# Patient Record
Sex: Female | Born: 1965 | Race: White | Hispanic: No | Marital: Married | Smoking: Former smoker
Health system: Southern US, Community
[De-identification: ages and names within clinical notes are randomized; demographics above are authoritative.]

## PROBLEM LIST (undated history)

## (undated) DIAGNOSIS — Z7189 Other specified counseling: Secondary | ICD-10-CM

## (undated) DIAGNOSIS — M1990 Unspecified osteoarthritis, unspecified site: Secondary | ICD-10-CM

## (undated) DIAGNOSIS — I10 Essential (primary) hypertension: Secondary | ICD-10-CM

## (undated) DIAGNOSIS — T7840XA Allergy, unspecified, initial encounter: Secondary | ICD-10-CM

## (undated) DIAGNOSIS — E785 Hyperlipidemia, unspecified: Secondary | ICD-10-CM

## (undated) DIAGNOSIS — J4520 Mild intermittent asthma, uncomplicated: Secondary | ICD-10-CM

## (undated) DIAGNOSIS — E119 Type 2 diabetes mellitus without complications: Secondary | ICD-10-CM

## (undated) DIAGNOSIS — R600 Localized edema: Secondary | ICD-10-CM

## (undated) HISTORY — DX: Hyperlipidemia, unspecified: E78.5

## (undated) HISTORY — DX: Type 2 diabetes mellitus without complications: E11.9

## (undated) HISTORY — DX: Localized edema: R60.0

## (undated) HISTORY — DX: Allergy, unspecified, initial encounter: T78.40XA

## (undated) HISTORY — DX: Mild intermittent asthma, uncomplicated: J45.20

## (undated) HISTORY — DX: Essential (primary) hypertension: I10

## (undated) HISTORY — DX: Other specified counseling: Z71.89

## (undated) HISTORY — DX: Unspecified osteoarthritis, unspecified site: M19.90

## (undated) HISTORY — PX: ADENOIDECTOMY: SUR15

---

## 2010-11-16 ENCOUNTER — Encounter: Payer: Self-pay | Admitting: Internal Medicine

## 2010-11-16 ENCOUNTER — Ambulatory Visit (INDEPENDENT_AMBULATORY_CARE_PROVIDER_SITE_OTHER): Payer: Managed Care, Other (non HMO) | Admitting: Internal Medicine

## 2010-11-16 VITALS — BP 120/92 | HR 98 | Ht 65.0 in | Wt 278.0 lb

## 2010-11-16 DIAGNOSIS — Z0000 Encounter for general adult medical examination without abnormal findings: Secondary | ICD-10-CM

## 2010-11-16 DIAGNOSIS — J45909 Unspecified asthma, uncomplicated: Secondary | ICD-10-CM

## 2010-11-16 DIAGNOSIS — E669 Obesity, unspecified: Secondary | ICD-10-CM | POA: Insufficient documentation

## 2010-11-16 DIAGNOSIS — I10 Essential (primary) hypertension: Secondary | ICD-10-CM

## 2010-11-16 DIAGNOSIS — J4520 Mild intermittent asthma, uncomplicated: Secondary | ICD-10-CM | POA: Insufficient documentation

## 2010-11-16 DIAGNOSIS — Z23 Encounter for immunization: Secondary | ICD-10-CM

## 2010-11-16 HISTORY — DX: Mild intermittent asthma, uncomplicated: J45.20

## 2010-11-16 MED ORDER — ALBUTEROL 90 MCG/ACT IN AERS: 2.0000 | INHALATION_SPRAY | Freq: Four times a day (QID) | RESPIRATORY_TRACT | Status: DC | PRN

## 2010-11-16 MED ORDER — LISINOPRIL 10 MG PO TABS: 10.0000 mg | ORAL_TABLET | Freq: Every day | ORAL | Status: DC

## 2010-11-16 NOTE — Assessment & Plan Note (Signed)
Stable and asx. RF albuterol mdi prn

## 2010-11-16 NOTE — Assessment & Plan Note (Signed)
Discussed dietary modification and regular aerobic exercise. Target wt loss 15lbs at 2 month follow

## 2010-11-16 NOTE — Progress Notes (Signed)
  Subjective:    Patient ID: Phyllis Martinez, female    DOB: Jun 22, 1965, 45 y.o.   MRN: [id number]  HPI  Pt presents to clinic to establish care and for evaluation of HTN and asthma. H/o HTN with minimal elevation today but typically normotensive control. Tolerates ace inhibitor without cough or angioedema. H/o asthma though has been well controlled without recent flare. Denies dyspnea or wheezing. Uses albuterol mdi prn but notes improved control since moving from [place] to [place]. No associated GERD sx's. Reports nl labs 2/12. Tetanus booster last obtained 1990. Last pap/cpe 10/11 and nl. Has never had mammogram and currently defers. No other complaints.   Reviewed pmh, psh, medications, allergies, soc hx and fam hx.    Review of Systems  Constitutional: Positive for unexpected weight change. Negative for fever, chills and fatigue.  HENT: Negative for hearing loss, congestion and facial swelling.   Respiratory: Negative for cough and shortness of breath.   Cardiovascular: Negative for chest pain and palpitations.  Gastrointestinal: Negative for abdominal pain and blood in stool.  All other systems reviewed and are negative.       Objective:   Physical Exam  Nursing note and vitals reviewed. Constitutional: She appears well-developed and well-nourished. No distress.  HENT:  Head: Normocephalic and atraumatic.  Right Ear: External ear normal.  Left Ear: External ear normal.  Nose: Nose normal.  Mouth/Throat: Oropharynx is clear and moist. No oropharyngeal exudate.  Eyes: Conjunctivae are normal. Right eye exhibits no discharge. Left eye exhibits no discharge. No scleral icterus.  Neck: Neck supple. No JVD present.  Cardiovascular: Normal rate, regular rhythm and normal heart sounds.  Exam reveals no gallop and no friction rub.   No murmur heard. Pulmonary/Chest: Effort normal and breath sounds normal. No respiratory distress. She has no wheezes. She has no rales.  Lymphadenopathy:    She has  no cervical adenopathy.  Neurological: She is alert.  Skin: Skin is warm and dry. No rash noted. She is not diaphoretic.       Left forearm nevi without irregularity or irritation.  Psychiatric: She has a normal mood and affect.          Assessment & Plan:

## 2010-11-16 NOTE — Assessment & Plan Note (Signed)
Mildly suboptimal control but appears isolated. Continue current regimen and monitor bp as outpt.

## 2011-02-14 ENCOUNTER — Ambulatory Visit: Payer: Managed Care, Other (non HMO) | Admitting: Internal Medicine

## 2011-05-13 ENCOUNTER — Encounter: Payer: Self-pay | Admitting: Internal Medicine

## 2011-05-13 ENCOUNTER — Ambulatory Visit (INDEPENDENT_AMBULATORY_CARE_PROVIDER_SITE_OTHER): Payer: 59 | Admitting: Internal Medicine

## 2011-05-13 VITALS — BP 134/90 | HR 76 | Temp 98.5°F | Resp 18 | Ht 64.0 in | Wt 276.0 lb

## 2011-05-13 DIAGNOSIS — Z0000 Encounter for general adult medical examination without abnormal findings: Secondary | ICD-10-CM

## 2011-05-13 LAB — CBC
HCT: 41 % (ref 36.0–46.0)
Hemoglobin: 13.6 g/dL (ref 12.0–15.0)
RBC: 4.6 MIL/uL (ref 3.87–5.11)
WBC: 8.7 10*3/uL (ref 4.0–10.5)

## 2011-05-13 LAB — BASIC METABOLIC PANEL
BUN: 11 mg/dL (ref 6–23)
CO2: 26 mEq/L (ref 19–32)
Chloride: 104 mEq/L (ref 96–112)
Glucose, Bld: 90 mg/dL (ref 70–99)
Potassium: 4.2 mEq/L (ref 3.5–5.3)

## 2011-05-13 LAB — HEPATIC FUNCTION PANEL
Bilirubin, Direct: 0.1 mg/dL (ref 0.0–0.3)
Indirect Bilirubin: 0.3 mg/dL (ref 0.0–0.9)
Total Protein: 7 g/dL (ref 6.0–8.3)

## 2011-05-13 LAB — LIPID PANEL
LDL Cholesterol: 140 mg/dL — ABNORMAL HIGH (ref 0–99)
VLDL: 44 mg/dL — ABNORMAL HIGH (ref 0–40)

## 2011-05-13 LAB — TSH: TSH: 1.815 u[IU]/mL (ref 0.350–4.500)

## 2011-05-13 MED ORDER — FLUTICASONE PROPIONATE HFA 110 MCG/ACT IN AERO: 2.0000 | INHALATION_SPRAY | Freq: Two times a day (BID) | RESPIRATORY_TRACT | Status: DC

## 2011-05-13 NOTE — Progress Notes (Signed)
  Subjective:    Patient ID: Phyllis Martinez, female    DOB: 1966-04-11, 45 y.o.   MRN: [id number]  HPI Pt presents to clinic for annual exam. Notes asthma control has worsened due to tobacco smoke exposure at work. Using albuterol mdi on average twice a day. No current wheezing or dyspnea. Recalls possible side effects from advair but is unsure. DBP minimally elevated but recent stress in life. Compliant with medication without adverse effect. Pap utd 2/12. Has not undergone mammogram and not currently interested. No other complaints.  Past Medical History  Diagnosis Date  . Arthritis     RA  . Asthma   . Allergy   . Hypertension    Past Surgical History  Procedure Date  . Cesarean section   . Adenoidectomy     reports that she has quit smoking. She has never used smokeless tobacco. She reports that she drinks alcohol. Her drug history not on file. family history includes Arthritis in her father and maternal grandmother; Cancer in her father; Heart disease in her daughters; and Hyperlipidemia in her mother. No Known Allergies   Review of Systems see hpi     Objective:   Physical Exam  Nursing note and vitals reviewed. Constitutional: She appears well-developed and well-nourished. No distress.  HENT:  Head: Normocephalic and atraumatic.  Right Ear: Tympanic membrane, external ear and ear canal normal.  Left Ear: Tympanic membrane, external ear and ear canal normal.  Nose: Nose normal.  Mouth/Throat: Oropharynx is clear and moist. No oropharyngeal exudate.  Eyes: Conjunctivae and EOM are normal. Pupils are equal, round, and reactive to light. Right eye exhibits no discharge. Left eye exhibits no discharge. No scleral icterus.  Neck: Neck supple. No thyromegaly present.  Cardiovascular: Normal rate, regular rhythm, normal heart sounds and intact distal pulses.  Exam reveals no gallop and no friction rub.   No murmur heard. Pulmonary/Chest: Effort normal and breath sounds normal. No  respiratory distress. She has no wheezes. She has no rales.  Abdominal: Soft. Normal appearance and bowel sounds are normal. She exhibits no distension. There is no hepatosplenomegaly. There is no tenderness. There is no rebound and no guarding.  Lymphadenopathy:    She has no cervical adenopathy.  Neurological: She is alert.  Skin: Skin is warm and dry. She is not diaphoretic.  Psychiatric: She has a normal mood and affect.          Assessment & Plan:

## 2011-05-13 NOTE — Assessment & Plan Note (Signed)
Nl exam. Pap utd. Declines mammogram currently. Administer influenza vaccine. Obtain cpe screening labs. Add flovent daily to asthma regimen (rinse mouth after using). Monitor bp as outpt and if remains elevated notify clinic for dose adjustment.

## 2011-05-14 LAB — URINALYSIS, ROUTINE W REFLEX MICROSCOPIC
Bilirubin Urine: NEGATIVE
Nitrite: NEGATIVE
Specific Gravity, Urine: 1.021 (ref 1.005–1.030)
Urobilinogen, UA: 0.2 mg/dL (ref 0.0–1.0)

## 2011-05-18 ENCOUNTER — Other Ambulatory Visit: Payer: Self-pay | Admitting: Internal Medicine

## 2011-05-18 DIAGNOSIS — E785 Hyperlipidemia, unspecified: Secondary | ICD-10-CM

## 2011-05-21 ENCOUNTER — Encounter: Payer: Self-pay | Admitting: Internal Medicine

## 2011-10-14 ENCOUNTER — Ambulatory Visit: Payer: 59 | Admitting: Internal Medicine

## 2011-11-28 ENCOUNTER — Other Ambulatory Visit: Payer: Self-pay | Admitting: Internal Medicine

## 2011-11-28 NOTE — Telephone Encounter (Signed)
Rx refill sent to pharmacy. 

## 2011-12-01 ENCOUNTER — Other Ambulatory Visit: Payer: Self-pay | Admitting: Internal Medicine

## 2011-12-02 NOTE — Telephone Encounter (Signed)
Rx refill sent to pharmacy. 

## 2012-01-20 ENCOUNTER — Telehealth: Payer: Self-pay | Admitting: Internal Medicine

## 2012-01-20 MED ORDER — LISINOPRIL 10 MG PO TABS: ORAL_TABLET | ORAL | Status: DC

## 2012-01-20 NOTE — Telephone Encounter (Signed)
Refill- lisinopril 10mg  tablets. Take one tablet by mouth every day. Qty 30 last fill 7.20.13

## 2012-01-20 NOTE — Telephone Encounter (Signed)
Rx 30-day supply given: Patient Overdue for OV/SLS

## 2012-02-10 ENCOUNTER — Ambulatory Visit (INDEPENDENT_AMBULATORY_CARE_PROVIDER_SITE_OTHER): Payer: Managed Care, Other (non HMO) | Admitting: Internal Medicine

## 2012-02-10 ENCOUNTER — Encounter: Payer: Self-pay | Admitting: Internal Medicine

## 2012-02-10 VITALS — BP 124/82 | HR 68 | Temp 98.1°F | Resp 16 | Ht 64.5 in | Wt 300.5 lb

## 2012-02-10 DIAGNOSIS — E041 Nontoxic single thyroid nodule: Secondary | ICD-10-CM

## 2012-02-10 DIAGNOSIS — Z79899 Other long term (current) drug therapy: Secondary | ICD-10-CM

## 2012-02-10 DIAGNOSIS — I10 Essential (primary) hypertension: Secondary | ICD-10-CM

## 2012-02-10 DIAGNOSIS — E785 Hyperlipidemia, unspecified: Secondary | ICD-10-CM

## 2012-02-11 LAB — BASIC METABOLIC PANEL
Calcium: 9.5 mg/dL (ref 8.4–10.5)
Sodium: 138 mEq/L (ref 135–145)

## 2012-02-11 LAB — LIPID PANEL
Cholesterol: 250 mg/dL — ABNORMAL HIGH (ref 0–200)
HDL: 40 mg/dL (ref >39)
Total CHOL/HDL Ratio: 6.3 Ratio
Triglycerides: 248 mg/dL — ABNORMAL HIGH (ref <150)

## 2012-02-11 LAB — TSH: TSH: 2.469 u[IU]/mL (ref 0.350–4.500)

## 2012-02-12 NOTE — Progress Notes (Signed)
  Subjective:    Patient ID: Phyllis Martinez, female    DOB: [birth year], 46 y.o.   MRN: [id number]  HPI Pt presents to clinic for followup of multiple medical problems. BP reviewed normotensive. No recent asthma flares or dyspnea. No cough or lip swelling. Defers mammogram.  Past Medical History  Diagnosis Date  . Arthritis     RA  . Asthma   . Allergy   . Hypertension    Past Surgical History  Procedure Date  . Cesarean section   . Adenoidectomy     reports that she has quit smoking. She has never used smokeless tobacco. She reports that she drinks alcohol. Her drug history not on file. family history includes Arthritis in her father and maternal grandmother; Cancer in her father; Heart disease in her daughters; and Hyperlipidemia in her mother. No Known Allergies    Review of Systems see hpi     Objective:   Physical Exam  Physical Exam  Nursing note and vitals reviewed. Constitutional: Appears well-developed and well-nourished. No distress.  HENT:  Head: Normocephalic and atraumatic.  Right Ear: External ear normal.  Left Ear: External ear normal.  Eyes: Conjunctivae are normal. No scleral icterus.  Neck: Neck supple. Carotid bruit is not present. possible small left thyroid nodule. NT Cardiovascular: Normal rate, regular rhythm and normal heart sounds.  Exam reveals no gallop and no friction rub.   No murmur heard. Pulmonary/Chest: Effort normal and breath sounds normal. No respiratory distress. He has no wheezes. no rales.  Lymphadenopathy:    He has no cervical adenopathy.  Neurological:Alert.  Skin: Skin is warm and dry. Not diaphoretic.  Psychiatric: Has a normal mood and affect.        Assessment & Plan:

## 2012-02-12 NOTE — Assessment & Plan Note (Signed)
Normotensive and stable. Continue current regimen. Monitor bp as outpt and followup in clinic as scheduled. Obtain chem7 and lipid. 

## 2012-02-12 NOTE — Assessment & Plan Note (Signed)
Schedule thyroid US. Obtain tsh

## 2012-02-13 ENCOUNTER — Ambulatory Visit (HOSPITAL_BASED_OUTPATIENT_CLINIC_OR_DEPARTMENT_OTHER)
Admission: RE | Admit: 2012-02-13 | Discharge: 2012-02-13 | Disposition: A | Discharge status: Home / Self Care | Payer: Managed Care, Other (non HMO) | Source: Ambulatory Visit | Attending: Internal Medicine | Admitting: Internal Medicine

## 2012-02-13 DIAGNOSIS — E041 Nontoxic single thyroid nodule: Secondary | ICD-10-CM

## 2012-02-23 ENCOUNTER — Telehealth: Payer: Self-pay | Admitting: *Deleted

## 2012-02-23 DIAGNOSIS — E042 Nontoxic multinodular goiter: Secondary | ICD-10-CM

## 2012-02-23 DIAGNOSIS — E785 Hyperlipidemia, unspecified: Secondary | ICD-10-CM

## 2012-02-23 NOTE — Telephone Encounter (Signed)
LMOM with contact name & number for return call RE: results & further instructions and recommendations from MD/SLS New Rx pending verification of pharmacy & future lab orders placed in EMR/SLS

## 2012-02-23 NOTE — Telephone Encounter (Signed)
Message copied by Regis Bill on Thu Feb 23, 2012  4:10 PM ------      Message from: Edwyna Perfect      Created: Sun Feb 19, 2012 10:21 PM       1) thyroid US does show nodules on both sides. Are big enough they need to be evaluated.  Recommend endocrine consult dx-thyroid nodules      2) chol worsening. Recommend low dose medication. lipitor 20mg  po qd and recheck lipid/lft 272.4 in 1 month fasting.

## 2012-02-24 ENCOUNTER — Telehealth: Payer: Self-pay | Admitting: Internal Medicine

## 2012-02-24 MED ORDER — LISINOPRIL 10 MG PO TABS: ORAL_TABLET | ORAL | Status: DC

## 2012-02-24 NOTE — Telephone Encounter (Signed)
Refill- lisinopril 10mg  tab. Take one tablet by mouth every day. Qty 30 last fill 8.16.13

## 2012-02-24 NOTE — Telephone Encounter (Signed)
Rx done/SLS 

## 2012-02-28 ENCOUNTER — Telehealth: Payer: Self-pay | Admitting: Internal Medicine

## 2012-02-28 NOTE — Telephone Encounter (Signed)
Patient is requesting last labs and sonogram results

## 2012-02-28 NOTE — Telephone Encounter (Signed)
See 9/19 phone note 

## 2012-02-29 ENCOUNTER — Other Ambulatory Visit: Payer: Self-pay | Admitting: Internal Medicine

## 2012-02-29 DIAGNOSIS — E042 Nontoxic multinodular goiter: Secondary | ICD-10-CM

## 2012-02-29 NOTE — Telephone Encounter (Signed)
Spoke with patient; Ok for Endo referral [Done in EMR], she does not want to start a new medication at this time; she would like to see endocrinology prior as she feels elevated labs could be part of thyroid issue. Pt will come in for follow-up labs/SLS

## 2012-02-29 NOTE — Telephone Encounter (Signed)
Referral order already placed 9/25

## 2012-02-29 NOTE — Telephone Encounter (Signed)
FYI: Spoke with patient; Ok for Endo referral [Done in EMR], she does not want to start a new medication at this time; she would like to see endocrinology prior as she feels elevated labs could be part of thyroid issue. Pt will come in for follow-up labs/SLS

## 2012-03-05 ENCOUNTER — Ambulatory Visit (INDEPENDENT_AMBULATORY_CARE_PROVIDER_SITE_OTHER): Payer: Managed Care, Other (non HMO) | Admitting: Endocrinology

## 2012-03-05 ENCOUNTER — Other Ambulatory Visit: Payer: Self-pay | Admitting: Endocrinology

## 2012-03-05 ENCOUNTER — Encounter: Payer: Self-pay | Admitting: Endocrinology

## 2012-03-05 VITALS — BP 124/84 | HR 73 | Temp 97.6°F | Resp 15 | Ht 64.75 in | Wt 303.0 lb

## 2012-03-05 DIAGNOSIS — E042 Nontoxic multinodular goiter: Secondary | ICD-10-CM | POA: Insufficient documentation

## 2012-03-05 NOTE — Progress Notes (Signed)
Subjective:    Patient ID: Phyllis Martinez, female    DOB: [birth year], 46 y.o.   MRN: [id number]  HPI Pt was incidentally noted at a routine ov, a few weeks ago, to have right thyroid nodule.  Pt says she was unaware of this, and has never had a thyroid problem before.  She does not notice any swelling at the anterior neck, or assoc pain.   Past Medical History  Diagnosis Date  . Arthritis     RA  . Asthma   . Allergy   . Hypertension     Past Surgical History  Procedure Date  . Cesarean section   . Adenoidectomy     History   Social History  . Marital Status: Married    Spouse Name: N/A    Number of Children: N/A  . Years of Education: N/A   Occupational History  . Not on file.   Social History Main Topics  . Smoking status: Former Games developer  . Smokeless tobacco: Never Used  . Alcohol Use: Yes  . Drug Use: Not on file  . Sexually Active: Not on file   Other Topics Concern  . Not on file   Social History Narrative  . No narrative on file    Current Outpatient Prescriptions on File Prior to Visit  Medication Sig Dispense Refill  . albuterol (VENTOLIN HFA) 108 (90 BASE) MCG/ACT inhaler Inhale 2 puffs into the lungs every 6 (six) hours as needed for wheezing. Office visit is required for refills  18 g  0  . fluticasone (FLOVENT HFA) 110 MCG/ACT inhaler Inhale 2 puffs into the lungs 2 (two) times daily.  1 Inhaler  6  . lisinopril (PRINIVIL,ZESTRIL) 10 MG tablet TAKE 1 TABLET BY MOUTH EVERY DAY  30 tablet  5  . DISCONTD: albuterol (PROVENTIL,VENTOLIN) 90 MCG/ACT inhaler Inhale 2 puffs into the lungs every 6 (six) hours as needed.  17 g  11    No Known Allergies  Family History  Problem Relation Age of Onset  . Hyperlipidemia Mother   . Arthritis Father     RA  . Cancer Father     prostate  . Heart disease Daughter   . Arthritis Maternal Grandmother   . Heart disease Daughter   mother has hypothyroidism  BP 124/84  Pulse 73  Temp 97.6 F (36.4 C) (Oral)   Resp 15  Ht 5' 4.75" (1.645 m)  Wt 303 lb (137.44 kg)  BMI 50.81 kg/m2  SpO2 96%  LMP 01/11/2012  Review of Systems denies headache, hoarseness, double vision, palpitations, sob, diarrhea, polyuria, myalgias, excessive diaphoresis, numbness, tremor, anxiety, hypoglycemia, and rhinorrhea.  She has difficulty losing weight, and easy bruising.    Objective:   Physical Exam VS: see vs page GEN: no distress HEAD: head: no deformity eyes: no periorbital swelling, no proptosis external nose and ears are normal mouth: no lesion seen NECK: 2 cm right thyroid nodule.   CHEST WALL: no deformity LUNGS:  Clear to auscultation CV: reg rate and rhythm, no murmur. ABD: abdomen is soft, nontender.  no hepatosplenomegaly.  not distended.  no hernia. MUSCULOSKELETAL: muscle bulk and strength are grossly normal.  no obvious joint swelling.  gait is normal and steady EXTEMITIES: no deformity.  no ulcer on the feet.  feet are of normal color and temp.  no edema PULSES: dorsalis pedis intact bilat.  no carotid bruit NEURO:  cn 2-12 grossly intact.   readily moves all 4's.  sensation is intact to  touch on the feet SKIN:  Normal texture and temperature.  No rash or suspicious lesion is visible.   NODES:  None palpable at the neck.   PSYCH: alert, oriented x3.  Does not appear anxious nor depressed.  Lab Results  Component Value Date   TSH 2.469 02/10/2012   (i reviewed [place] images with patient)    Assessment & Plan:  Multinodular goiter, which is usually hereditary Obesity, not thyroid-related Asthma. Goiter is not large enough to exac sob.

## 2012-03-05 NOTE — Patient Instructions (Addendum)
Let's check a biopsy of the thyroid, guided by ultrasound.  you will receive a phone call, about a day and time for an appointment. Please return in 1 year. your "lumpy thyroid" will eventually become either overactive or underactive.  this is usually a slow process, happening over the span of many years.

## 2012-03-07 ENCOUNTER — Ambulatory Visit
Admission: RE | Admit: 2012-03-07 | Discharge: 2012-03-07 | Disposition: A | Discharge status: Home / Self Care | Payer: Managed Care, Other (non HMO) | Source: Ambulatory Visit | Attending: Endocrinology | Admitting: Endocrinology

## 2012-03-07 ENCOUNTER — Other Ambulatory Visit (HOSPITAL_COMMUNITY)
Admission: RE | Admit: 2012-03-07 | Discharge: 2012-03-07 | Disposition: A | Discharge status: Home / Self Care | Payer: Managed Care, Other (non HMO) | Source: Ambulatory Visit | Attending: Interventional Radiology | Admitting: Interventional Radiology

## 2012-03-07 DIAGNOSIS — E042 Nontoxic multinodular goiter: Secondary | ICD-10-CM

## 2012-03-07 DIAGNOSIS — E049 Nontoxic goiter, unspecified: Secondary | ICD-10-CM | POA: Insufficient documentation

## 2012-03-20 ENCOUNTER — Other Ambulatory Visit: Payer: Self-pay | Admitting: Internal Medicine

## 2012-03-21 NOTE — Telephone Encounter (Signed)
Done/SLS 

## 2012-06-19 ENCOUNTER — Other Ambulatory Visit: Payer: Self-pay | Admitting: Internal Medicine

## 2012-06-19 ENCOUNTER — Telehealth: Payer: Self-pay | Admitting: Internal Medicine

## 2012-06-19 DIAGNOSIS — Z124 Encounter for screening for malignant neoplasm of cervix: Secondary | ICD-10-CM

## 2012-06-19 NOTE — Telephone Encounter (Signed)
Patient would like a referral to a gynacologist

## 2012-06-19 NOTE — Telephone Encounter (Signed)
Spoke with patient & she is not having any medical issues; would just like "referral to GYN" for annual/PAP exam/SLS

## 2012-06-19 NOTE — Telephone Encounter (Signed)
Referral order placed.

## 2012-07-21 ENCOUNTER — Other Ambulatory Visit: Payer: Self-pay

## 2012-07-24 ENCOUNTER — Other Ambulatory Visit: Payer: Self-pay | Admitting: Obstetrics and Gynecology

## 2012-07-24 DIAGNOSIS — R928 Other abnormal and inconclusive findings on diagnostic imaging of breast: Secondary | ICD-10-CM

## 2012-08-01 ENCOUNTER — Ambulatory Visit
Admission: RE | Admit: 2012-08-01 | Discharge: 2012-08-01 | Disposition: A | Discharge status: Home / Self Care | Payer: Managed Care, Other (non HMO) | Source: Ambulatory Visit | Attending: Obstetrics and Gynecology | Admitting: Obstetrics and Gynecology

## 2012-08-09 ENCOUNTER — Ambulatory Visit: Payer: Managed Care, Other (non HMO) | Admitting: Family Medicine

## 2012-08-09 ENCOUNTER — Other Ambulatory Visit: Payer: Self-pay | Admitting: Obstetrics and Gynecology

## 2012-09-05 ENCOUNTER — Telehealth: Payer: Self-pay | Admitting: Internal Medicine

## 2012-09-05 MED ORDER — LISINOPRIL 10 MG PO TABS: ORAL_TABLET | ORAL | Status: DC

## 2012-09-05 NOTE — Telephone Encounter (Signed)
Refill- lisinopril 10mg  tablets. Take one tablet by mouth every day. Qty 30 last fill 3.6.14

## 2012-10-17 ENCOUNTER — Telehealth: Payer: Self-pay | Admitting: Internal Medicine

## 2012-10-17 MED ORDER — ALBUTEROL SULFATE HFA 108 (90 BASE) MCG/ACT IN AERS: 2.0000 | INHALATION_SPRAY | Freq: Four times a day (QID) | RESPIRATORY_TRACT | Status: DC | PRN

## 2012-10-17 NOTE — Telephone Encounter (Signed)
Refill ventolin

## 2012-10-17 NOTE — Telephone Encounter (Signed)
Left a message for pt to return my call. 1 inhaler sent to pharmacy. Pt needs appt for addt refills. Dr Rodena Medin asked pt to return in March

## 2012-11-20 ENCOUNTER — Other Ambulatory Visit: Payer: Self-pay | Admitting: Obstetrics and Gynecology

## 2013-03-15 ENCOUNTER — Other Ambulatory Visit: Payer: Self-pay | Admitting: Family Medicine

## 2013-03-20 ENCOUNTER — Telehealth: Payer: Self-pay | Admitting: Internal Medicine

## 2013-03-20 DIAGNOSIS — E785 Hyperlipidemia, unspecified: Secondary | ICD-10-CM

## 2013-03-20 DIAGNOSIS — I10 Essential (primary) hypertension: Secondary | ICD-10-CM

## 2013-03-20 NOTE — Telephone Encounter (Signed)
Patient has cpe scheduled for 05/21/13 and will be going to [facility] lab prior.

## 2013-03-21 NOTE — Telephone Encounter (Signed)
Please advise which labs and diagnosis to be done?

## 2013-03-21 NOTE — Telephone Encounter (Signed)
Check lipid, renal, cbc, tsh, hepatic for hyperlipidemia and HTN

## 2013-03-25 NOTE — Telephone Encounter (Signed)
Lab order placed. I don't believe I put the expected date. Beginning of December

## 2013-04-03 ENCOUNTER — Other Ambulatory Visit: Payer: Self-pay | Admitting: Obstetrics and Gynecology

## 2013-04-11 ENCOUNTER — Other Ambulatory Visit: Payer: Self-pay

## 2013-04-16 ENCOUNTER — Ambulatory Visit (INDEPENDENT_AMBULATORY_CARE_PROVIDER_SITE_OTHER): Payer: Managed Care, Other (non HMO) | Admitting: Family Medicine

## 2013-04-16 ENCOUNTER — Telehealth: Payer: Self-pay | Admitting: Family Medicine

## 2013-04-16 ENCOUNTER — Encounter: Payer: Self-pay | Admitting: Family Medicine

## 2013-04-16 VITALS — BP 181/104 | HR 74 | Temp 98.1°F | Resp 16 | Ht 64.0 in | Wt 306.2 lb

## 2013-04-16 DIAGNOSIS — Z0000 Encounter for general adult medical examination without abnormal findings: Secondary | ICD-10-CM

## 2013-04-16 DIAGNOSIS — E785 Hyperlipidemia, unspecified: Secondary | ICD-10-CM

## 2013-04-16 DIAGNOSIS — I10 Essential (primary) hypertension: Secondary | ICD-10-CM

## 2013-04-16 DIAGNOSIS — J45909 Unspecified asthma, uncomplicated: Secondary | ICD-10-CM

## 2013-04-16 DIAGNOSIS — E669 Obesity, unspecified: Secondary | ICD-10-CM

## 2013-04-16 MED ORDER — ALBUTEROL SULFATE HFA 108 (90 BASE) MCG/ACT IN AERS: 2.0000 | INHALATION_SPRAY | Freq: Four times a day (QID) | RESPIRATORY_TRACT | Status: DC | PRN

## 2013-04-16 MED ORDER — FLUTICASONE PROPIONATE HFA 110 MCG/ACT IN AERO: 2.0000 | INHALATION_SPRAY | Freq: Two times a day (BID) | RESPIRATORY_TRACT | Status: DC | PRN

## 2013-04-16 MED ORDER — METOPROLOL SUCCINATE ER 25 MG PO TB24: 25.0000 mg | ORAL_TABLET | Freq: Every day | ORAL | Status: DC

## 2013-04-16 NOTE — Telephone Encounter (Signed)
Labs prior to annual visit lipid, renal, liver, tsh, cbc   Patient has cpe on 05/21/13 and will be going to [facility] lab

## 2013-04-16 NOTE — Patient Instructions (Signed)
DASH Diet  The DASH diet stands for "Dietary Approaches to Stop Hypertension." It is a healthy eating plan that has been shown to reduce high blood pressure (hypertension) in as little as 14 days, while also possibly providing other significant health benefits. These other health benefits include reducing the risk of breast cancer after menopause and reducing the risk of type 2 diabetes, heart disease, colon cancer, and stroke. Health benefits also include weight loss and slowing kidney failure in patients with chronic kidney disease.   DIET GUIDELINES  · Limit salt (sodium). Your diet should contain less than 1500 mg of sodium daily.  · Limit refined or processed carbohydrates. Your diet should include mostly whole grains. Desserts and added sugars should be used sparingly.  · Include small amounts of heart-healthy fats. These types of fats include nuts, oils, and tub margarine. Limit saturated and trans fats. These fats have been shown to be harmful in the body.  CHOOSING FOODS   The following food groups are based on a 2000 calorie diet. See your Registered Dietitian for individual calorie needs.  Grains and Grain Products (6 to 8 servings daily)  · Eat More Often: Whole-wheat bread, brown rice, whole-grain or wheat pasta, quinoa, popcorn without added fat or salt (air popped).  · Eat Less Often: White bread, white pasta, white rice, cornbread.  Vegetables (4 to 5 servings daily)  · Eat More Often: Fresh, frozen, and canned vegetables. Vegetables may be raw, steamed, roasted, or grilled with a minimal amount of fat.  · Eat Less Often/Avoid: Creamed or fried vegetables. Vegetables in a cheese sauce.  Fruit (4 to 5 servings daily)  · Eat More Often: All fresh, canned (in natural juice), or frozen fruits. Dried fruits without added sugar. One hundred percent fruit juice (½ cup [237 mL] daily).  · Eat Less Often: Dried fruits with added sugar. Canned fruit in light or heavy syrup.  Lean Meats, Fish, and Poultry (2  servings or less daily. One serving is 3 to 4 oz [85-114 g]).  · Eat More Often: Ninety percent or leaner ground beef, tenderloin, sirloin. Round cuts of beef, chicken breast, turkey breast. All fish. Grill, bake, or broil your meat. Nothing should be fried.  · Eat Less Often/Avoid: Fatty cuts of meat, turkey, or chicken leg, thigh, or wing. Fried cuts of meat or fish.  Dairy (2 to 3 servings)  · Eat More Often: Low-fat or fat-free milk, low-fat plain or light yogurt, reduced-fat or part-skim cheese.  · Eat Less Often/Avoid: Milk (whole, 2%). Whole milk yogurt. Full-fat cheeses.  Nuts, Seeds, and Legumes (4 to 5 servings per week)  · Eat More Often: All without added salt.  · Eat Less Often/Avoid: Salted nuts and seeds, canned beans with added salt.  Fats and Sweets (limited)  · Eat More Often: Vegetable oils, tub margarines without trans fats, sugar-free gelatin. Mayonnaise and salad dressings.  · Eat Less Often/Avoid: Coconut oils, palm oils, butter, stick margarine, cream, half and half, cookies, candy, pie.  FOR MORE INFORMATION  The Dash Diet Eating Plan: www.dashdiet.org  Document Released: 05/12/2011 Document Revised: 08/15/2011 Document Reviewed: 05/12/2011  Exit[employer]® Patient Information ©2014 [employer].

## 2013-04-16 NOTE — Progress Notes (Signed)
Pre visit review using our clinic review tool, if applicable. No additional management support is needed unless otherwise documented below in the visit note/SLS  

## 2013-04-21 ENCOUNTER — Encounter: Payer: Self-pay | Admitting: Family Medicine

## 2013-04-21 DIAGNOSIS — E782 Mixed hyperlipidemia: Secondary | ICD-10-CM | POA: Insufficient documentation

## 2013-04-21 DIAGNOSIS — E785 Hyperlipidemia, unspecified: Secondary | ICD-10-CM

## 2013-04-21 HISTORY — DX: Hyperlipidemia, unspecified: E78.5

## 2013-04-21 NOTE — Assessment & Plan Note (Signed)
Actually doing quite well, only uses meds intermittently. Given refills today

## 2013-04-21 NOTE — Assessment & Plan Note (Signed)
Encouraged DASH diet and check labs priorto annual exam

## 2013-04-21 NOTE — Assessment & Plan Note (Signed)
New, encouraged heart healthy diet and will discuss more options at next visit.

## 2013-04-21 NOTE — Progress Notes (Signed)
Patient ID: Phyllis Martinez, female   DOB: 1965/09/14, 47 y.o.   MRN: [id number] Markiah Janeway [id number] 1965-08-18 04/21/2013      Progress Note-Follow Up  Subjective  Chief Complaint  Chief Complaint  Patient presents with  . My Chart OV    Asthma & BP Management/check-up  . Immunizations    Discuss need for Pneumonia/Prevnar vaccine [pt never had]    HPI  4 ongoing medical care. She has not been seen by this physician or in his office for over a year. She reports feeling well but not to she's run out of her blood pressure medications and so has not taken them today. Report her asthma is well-controlled on her Flovent and she needs albuterol infrequently. She denies any recent illness. She does note she was struggling with some heartburn but since stopping the lisinopril her heartburn is actually resolved. She denies any complaints. No headaches, chest pain, palpitations, shortness of breath, GI or GU concerns at this time  Past Medical History  Diagnosis Date  . Arthritis     RA  . Asthma   . Allergy   . Hypertension     Past Surgical History  Procedure Laterality Date  . Cesarean section    . Adenoidectomy      Family History  Problem Relation Age of Onset  . Hyperlipidemia Mother   . Arthritis Father     RA  . Cancer Father     prostate  . Heart disease Daughter   . Arthritis Maternal Grandmother   . Heart disease Daughter     History   Social History  . Marital Status: Married    Spouse Name: N/A    Number of Children: N/A  . Years of Education: N/A   Occupational History  . Not on file.   Social History Main Topics  . Smoking status: Former Games developer  . Smokeless tobacco: Never Used  . Alcohol Use: Yes  . Drug Use: Not on file  . Sexual Activity: Not on file   Other Topics Concern  . Not on file   Social History Narrative  . No narrative on file    Current Outpatient Prescriptions on File Prior to Visit  Medication Sig Dispense Refill  .  [DISCONTINUED] albuterol (PROVENTIL,VENTOLIN) 90 MCG/ACT inhaler Inhale 2 puffs into the lungs every 6 (six) hours as needed.  17 g  11   No current facility-administered medications on file prior to visit.    No Known Allergies  Review of Systems  Review of Systems  Constitutional: Negative for fever and malaise/fatigue.  HENT: Negative for congestion.   Eyes: Negative for discharge.  Respiratory: Negative for shortness of breath.   Cardiovascular: Negative for chest pain, palpitations and leg swelling.  Gastrointestinal: Negative for nausea, abdominal pain and diarrhea.  Genitourinary: Negative for dysuria.  Musculoskeletal: Negative for falls.  Skin: Negative for rash.  Neurological: Negative for loss of consciousness and headaches.  Endo/Heme/Allergies: Negative for polydipsia.  Psychiatric/Behavioral: Negative for depression and suicidal ideas. The patient is not nervous/anxious and does not have insomnia.     Objective  BP 181/104  Pulse 74  Temp(Src) 98.1 F (36.7 C) (Oral)  Resp 16  Ht 5\' 4"  (1.626 m)  Wt 306 lb 4 oz (138.914 kg)  BMI 52.54 kg/m2  SpO2 98%  Physical Exam  Physical Exam  Constitutional: She is oriented to person, place, and time and well-developed, well-nourished, and in no distress. No distress.  HENT:  Head: Normocephalic and  atraumatic.  Eyes: Conjunctivae are normal.  Neck: Neck supple. No thyromegaly present.  Cardiovascular: Normal rate, regular rhythm and normal heart sounds.   No murmur heard. Pulmonary/Chest: Effort normal and breath sounds normal. She has no wheezes.  Abdominal: She exhibits no distension and no mass.  Musculoskeletal: She exhibits no edema.  Lymphadenopathy:    She has no cervical adenopathy.  Neurological: She is alert and oriented to person, place, and time.  Skin: Skin is warm and dry. No rash noted. She is not diaphoretic.  Psychiatric: Memory, affect and judgment normal.    Lab Results  Component Value  Date   TSH 2.469 02/10/2012   Lab Results  Component Value Date   WBC 8.7 05/13/2011   HGB 13.6 05/13/2011   HCT 41.0 05/13/2011   MCV 89.1 05/13/2011   PLT 354 05/13/2011   Lab Results  Component Value Date   CREATININE 0.78 02/10/2012   BUN 15 02/10/2012   NA 138 02/10/2012   K 4.2 02/10/2012   CL 104 02/10/2012   CO2 26 02/10/2012   Lab Results  Component Value Date   ALT 14 05/13/2011   AST 18 05/13/2011   ALKPHOS 48 05/13/2011   BILITOT 0.4 05/13/2011   Lab Results  Component Value Date   CHOL 250* 02/10/2012   Lab Results  Component Value Date   HDL 40 02/10/2012   Lab Results  Component Value Date   LDLCALC 160* 02/10/2012   Lab Results  Component Value Date   TRIG 248* 02/10/2012   Lab Results  Component Value Date   CHOLHDL 6.3 02/10/2012     Assessment & Plan  HTN (hypertension) Patient did not take her bp meds today due to running out. Her heartburn resolved when she ran out of her Lisinopril so we wil lnot restart, we will try Metoprolol XL 25 mg daily and she is encouraged to try DASH diet and avoid caffeine, reassess at next visit.  Asthma Actually doing quite well, only uses meds intermittently. Given refills today  Obesity Encouraged DASH diet and check labs priorto annual exam  Other and unspecified hyperlipidemia New, encouraged heart healthy diet and will discuss more options at next visit.

## 2013-04-21 NOTE — Assessment & Plan Note (Addendum)
Patient did not take her bp meds today due to running out. Her heartburn resolved when she ran out of her Lisinopril so we wil lnot restart, we will try Metoprolol XL 25 mg daily and she is encouraged to try DASH diet and avoid caffeine, reassess at next visit.

## 2013-04-25 NOTE — Telephone Encounter (Signed)
Lab orders entered as below. 

## 2013-05-14 ENCOUNTER — Encounter: Payer: Self-pay | Admitting: Family Medicine

## 2013-05-14 LAB — CBC
HCT: 39.7 % (ref 36.0–46.0)
Hemoglobin: 13.9 g/dL (ref 12.0–15.0)
MCV: 86.5 fL (ref 78.0–100.0)
RBC: 4.59 MIL/uL (ref 3.87–5.11)
WBC: 9.3 10*3/uL (ref 4.0–10.5)

## 2013-05-14 LAB — HEPATIC FUNCTION PANEL
ALT: 16 U/L (ref 0–35)
Bilirubin, Direct: 0.1 mg/dL (ref 0.0–0.3)
Indirect Bilirubin: 0.3 mg/dL (ref 0.0–0.9)
Total Bilirubin: 0.4 mg/dL (ref 0.3–1.2)

## 2013-05-14 LAB — RENAL FUNCTION PANEL
BUN: 13 mg/dL (ref 6–23)
CO2: 27 mEq/L (ref 19–32)
Chloride: 102 mEq/L (ref 96–112)
Creat: 0.72 mg/dL (ref 0.50–1.10)
Potassium: 4 mEq/L (ref 3.5–5.3)

## 2013-05-14 LAB — LIPID PANEL
Cholesterol: 235 mg/dL — ABNORMAL HIGH (ref 0–200)
LDL Cholesterol: 149 mg/dL — ABNORMAL HIGH (ref 0–99)
VLDL: 44 mg/dL — ABNORMAL HIGH (ref 0–40)

## 2013-05-15 LAB — TSH: TSH: 2.017 u[IU]/mL (ref 0.350–4.500)

## 2013-05-21 ENCOUNTER — Ambulatory Visit (INDEPENDENT_AMBULATORY_CARE_PROVIDER_SITE_OTHER): Payer: Managed Care, Other (non HMO) | Admitting: Family Medicine

## 2013-05-21 ENCOUNTER — Encounter: Payer: Self-pay | Admitting: Family Medicine

## 2013-05-21 VITALS — BP 158/98 | HR 97 | Temp 98.7°F | Ht 64.0 in | Wt 312.1 lb

## 2013-05-21 DIAGNOSIS — E042 Nontoxic multinodular goiter: Secondary | ICD-10-CM

## 2013-05-21 DIAGNOSIS — R600 Localized edema: Secondary | ICD-10-CM

## 2013-05-21 DIAGNOSIS — E785 Hyperlipidemia, unspecified: Secondary | ICD-10-CM

## 2013-05-21 DIAGNOSIS — R609 Edema, unspecified: Secondary | ICD-10-CM

## 2013-05-21 DIAGNOSIS — I10 Essential (primary) hypertension: Secondary | ICD-10-CM

## 2013-05-21 DIAGNOSIS — Z5189 Encounter for other specified aftercare: Secondary | ICD-10-CM

## 2013-05-21 DIAGNOSIS — T7840XD Allergy, unspecified, subsequent encounter: Secondary | ICD-10-CM

## 2013-05-21 DIAGNOSIS — E049 Nontoxic goiter, unspecified: Secondary | ICD-10-CM

## 2013-05-21 DIAGNOSIS — E669 Obesity, unspecified: Secondary | ICD-10-CM

## 2013-05-21 MED ORDER — FUROSEMIDE 20 MG PO TABS: 20.0000 mg | ORAL_TABLET | Freq: Every day | ORAL | Status: DC | PRN

## 2013-05-21 MED ORDER — LISINOPRIL 10 MG PO TABS: 10.0000 mg | ORAL_TABLET | Freq: Every day | ORAL | Status: DC

## 2013-05-21 NOTE — Progress Notes (Signed)
Pre visit review using our clinic review tool, if applicable. No additional management support is needed unless otherwise documented below in the visit note. 

## 2013-05-21 NOTE — Patient Instructions (Signed)
Avoid trans fats Start a Krill oil caps such as MegaRed caps daily Cholesterol Cholesterol is a white, waxy, fat-like protein needed by your body in small amounts. The liver makes all the cholesterol you need. It is carried from the liver by the blood through the blood vessels. Deposits (plaque) may build up on blood vessel walls. This makes the arteries narrower and stiffer. Plaque increases the risk for heart attack and stroke. You cannot feel your cholesterol level even if it is very high. The only way to know is by a blood test to check your lipid (fats) levels. Once you know your cholesterol levels, you should keep a record of the test results. Work with your caregiver to to keep your levels in the desired range. WHAT THE RESULTS MEAN:  Total cholesterol is a rough measure of all the cholesterol in your blood.  LDL is the so-called bad cholesterol. This is the type that deposits cholesterol in the walls of the arteries. You want this level to be low.  HDL is the good cholesterol because it cleans the arteries and carries the LDL away. You want this level to be high.  Triglycerides are fat that the body can either burn for energy or store. High levels are closely linked to heart disease. DESIRED LEVELS:  Total cholesterol below 200.  LDL below 100 for people at risk, below 70 for very high risk.  HDL above 50 is good, above 60 is best.  Triglycerides below 150. HOW TO LOWER YOUR CHOLESTEROL:  Diet.  Choose fish or white meat chicken and [place], roasted or baked. Limit fatty cuts of red meat, fried foods, and processed meats, such as sausage and lunch meat.  Eat lots of fresh fruits and vegetables. Choose whole grains, beans, pasta, potatoes and cereals.  Use only small amounts of olive, corn or canola oils. Avoid butter, mayonnaise, shortening or palm kernel oils. Avoid foods with trans-fats.  Use skim/nonfat milk and low-fat/nonfat yogurt and cheeses. Avoid whole milk, cream,  ice cream, egg yolks and cheeses. Healthy desserts include angel food cake, ginger snaps, animal crackers, hard candy, popsicles, and low-fat/nonfat frozen yogurt. Avoid pastries, cakes, pies and cookies.  Exercise.  A regular program helps decrease LDL and raises HDL.  Helps with weight control.  Do things that increase your activity level like gardening, walking, or taking the stairs.  Medication.  May be prescribed by your caregiver to help lowering cholesterol and the risk for heart disease.  You may need medicine even if your levels are normal if you have several risk factors. HOME CARE INSTRUCTIONS   Follow your diet and exercise programs as suggested by your caregiver.  Take medications as directed.  Have blood work done when your caregiver feels it is necessary. MAKE SURE YOU:   Understand these instructions.  Will watch your condition.  Will get help right away if you are not doing well or get worse. Document Released: 02/15/2001 Document Revised: 08/15/2011 Document Reviewed: 08/08/2007 [facility] Patient Information 2014 [place], [place].

## 2013-05-21 NOTE — Progress Notes (Signed)
Patient ID: Phyllis Martinez, female   DOB: [birth year], 48 y.o.   MRN: [id number] Phyllis Martinez [id number] Sep 05, 1965 05/21/2013      Progress Note-Follow Up  Subjective  Chief Complaint  Chief Complaint  Patient presents with  . Annual Exam    physical    HPI  Patient is a 47 year old female who is in today for followup. Her blood pressure continues to run high. She's been noting blood pressures ranging from 131/84 to a Max of 157/94. She denies headaches or chest pains. No shortness or breath GI or GU concerns. Does note some recent trouble with increased pedal edema bilaterally.  Past Medical History  Diagnosis Date  . Arthritis     RA  . Asthma   . Allergy   . Hypertension   . Other and unspecified hyperlipidemia 04/21/2013    Past Surgical History  Procedure Laterality Date  . Cesarean section    . Adenoidectomy      Family History  Problem Relation Age of Onset  . Hyperlipidemia Mother   . Arthritis Father     RA  . Cancer Father     prostate  . Heart disease Daughter   . Arthritis Maternal Grandmother   . Heart disease Daughter     History   Social History  . Marital Status: Married    Spouse Name: N/A    Number of Children: N/A  . Years of Education: N/A   Occupational History  . Not on file.   Social History Main Topics  . Smoking status: Former Games developer  . Smokeless tobacco: Never Used  . Alcohol Use: Yes  . Drug Use: Not on file  . Sexual Activity: Not on file   Other Topics Concern  . Not on file   Social History Narrative  . No narrative on file    Current Outpatient Prescriptions on File Prior to Visit  Medication Sig Dispense Refill  . albuterol (VENTOLIN HFA) 108 (90 BASE) MCG/ACT inhaler Inhale 2 puffs into the lungs every 6 (six) hours as needed for wheezing. Onlu fill if patient requests  18 g  0  . fluticasone (FLOVENT HFA) 110 MCG/ACT inhaler Inhale 2 puffs into the lungs 2 (two) times daily as needed (sob/wheeze). Only fill if  patient requests  1 Inhaler  6  . metoprolol succinate (TOPROL-XL) 25 MG 24 hr tablet Take 1 tablet (25 mg total) by mouth daily.  30 tablet  3  . progesterone (PROMETRIUM) 200 MG capsule       . [DISCONTINUED] albuterol (PROVENTIL,VENTOLIN) 90 MCG/ACT inhaler Inhale 2 puffs into the lungs every 6 (six) hours as needed.  17 g  11   No current facility-administered medications on file prior to visit.    No Known Allergies  Review of Systems  Review of Systems  Constitutional: Negative for fever and malaise/fatigue.  HENT: Negative for congestion.   Eyes: Negative for discharge.  Respiratory: Negative for shortness of breath.   Cardiovascular: Negative for chest pain, palpitations and leg swelling.  Gastrointestinal: Negative for nausea, abdominal pain and diarrhea.  Genitourinary: Negative for dysuria.  Musculoskeletal: Negative for falls.  Skin: Negative for rash.  Neurological: Negative for loss of consciousness and headaches.  Endo/Heme/Allergies: Negative for polydipsia.  Psychiatric/Behavioral: Negative for depression and suicidal ideas. The patient is not nervous/anxious and does not have insomnia.     Objective  BP 158/98  Pulse 97  Temp(Src) 98.7 F (37.1 C) (Oral)  Ht 5\' 4"  (1.626 m)  Wt  312 lb 1.3 oz (141.559 kg)  BMI 53.54 kg/m2  SpO2 96%  LMP 04/27/2013  Physical Exam  Physical Exam  Constitutional: She is oriented to person, place, and time and well-developed, well-nourished, and in no distress. No distress.  HENT:  Head: Normocephalic and atraumatic.  Eyes: Conjunctivae are normal.  Neck: Neck supple. No thyromegaly present.  Cardiovascular: Normal rate, regular rhythm and normal heart sounds.   No murmur heard. Pulmonary/Chest: Effort normal and breath sounds normal. She has no wheezes.  Abdominal: She exhibits no distension and no mass.  Musculoskeletal: She exhibits no edema.  Lymphadenopathy:    She has no cervical adenopathy.  Neurological: She  is alert and oriented to person, place, and time.  Skin: Skin is warm and dry. No rash noted. She is not diaphoretic.  Psychiatric: Memory, affect and judgment normal.    Lab Results  Component Value Date   TSH 2.017 05/14/2013   Lab Results  Component Value Date   WBC 9.3 05/14/2013   HGB 13.9 05/14/2013   HCT 39.7 05/14/2013   MCV 86.5 05/14/2013   PLT 348 05/14/2013   Lab Results  Component Value Date   CREATININE 0.72 05/14/2013   BUN 13 05/14/2013   NA 136 05/14/2013   K 4.0 05/14/2013   CL 102 05/14/2013   CO2 27 05/14/2013   Lab Results  Component Value Date   ALT 16 05/14/2013   AST 17 05/14/2013   ALKPHOS 55 05/14/2013   BILITOT 0.4 05/14/2013   Lab Results  Component Value Date   CHOL 235* 05/14/2013   Lab Results  Component Value Date   HDL 42 05/14/2013   Lab Results  Component Value Date   LDLCALC 149* 05/14/2013   Lab Results  Component Value Date   TRIG 222* 05/14/2013   Lab Results  Component Value Date   CHOLHDL 5.6 05/14/2013     Assessment & Plan  HTN (hypertension) Patient did not tolerate metoprolol. Did tolerate Lisinopril in past. Will restart at Lisinopril 10 mg daily, avoid simple sodium  Multinodular goiter TSH WNL  Obesity Encouraged DASH diet and regular exercise  Other and unspecified hyperlipidemia Avoid trans fats and increase exercise. 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 No changes  Pedal edema Encouraged DASH diet, elevate feet and given Lasix to use prn

## 2013-05-22 ENCOUNTER — Encounter: Payer: Self-pay | Admitting: Family Medicine

## 2013-05-26 ENCOUNTER — Encounter: Payer: Self-pay | Admitting: Family Medicine

## 2013-05-26 DIAGNOSIS — T7840XA Allergy, unspecified, initial encounter: Secondary | ICD-10-CM

## 2013-05-26 DIAGNOSIS — R600 Localized edema: Secondary | ICD-10-CM

## 2013-05-26 HISTORY — DX: Allergy, unspecified, initial encounter: T78.40XA

## 2013-05-26 HISTORY — DX: Localized edema: R60.0

## 2013-05-26 NOTE — Assessment & Plan Note (Signed)
Encouraged DASH diet and regular exercise 

## 2013-05-26 NOTE — Assessment & Plan Note (Signed)
Well controlled on current meds. No changes

## 2013-05-26 NOTE — Assessment & Plan Note (Signed)
Encouraged DASH diet, elevate feet and given Lasix to use prn

## 2013-05-26 NOTE — Assessment & Plan Note (Signed)
Avoid trans fats and increase exercise. Add krill oil caps

## 2013-05-26 NOTE — Assessment & Plan Note (Signed)
Patient did not tolerate metoprolol. Did tolerate Lisinopril in past. Will restart at Lisinopril 10 mg daily, avoid simple sodium

## 2013-05-26 NOTE — Assessment & Plan Note (Signed)
TSH WNL

## 2013-06-10 ENCOUNTER — Ambulatory Visit (HOSPITAL_BASED_OUTPATIENT_CLINIC_OR_DEPARTMENT_OTHER)
Admission: RE | Admit: 2013-06-10 | Discharge: 2013-06-10 | Disposition: A | Discharge status: Home / Self Care | Payer: Managed Care, Other (non HMO) | Source: Ambulatory Visit | Attending: Family Medicine | Admitting: Family Medicine

## 2013-06-10 DIAGNOSIS — E041 Nontoxic single thyroid nodule: Secondary | ICD-10-CM | POA: Insufficient documentation

## 2013-06-10 DIAGNOSIS — E049 Nontoxic goiter, unspecified: Secondary | ICD-10-CM

## 2013-07-02 ENCOUNTER — Encounter: Payer: Self-pay | Admitting: Family Medicine

## 2013-07-02 ENCOUNTER — Ambulatory Visit (INDEPENDENT_AMBULATORY_CARE_PROVIDER_SITE_OTHER): Payer: Managed Care, Other (non HMO) | Admitting: Family Medicine

## 2013-07-02 ENCOUNTER — Telehealth: Payer: Self-pay | Admitting: Family Medicine

## 2013-07-02 VITALS — BP 128/98 | HR 74 | Temp 97.8°F | Ht 64.0 in | Wt 299.0 lb

## 2013-07-02 DIAGNOSIS — I10 Essential (primary) hypertension: Secondary | ICD-10-CM

## 2013-07-02 DIAGNOSIS — E785 Hyperlipidemia, unspecified: Secondary | ICD-10-CM

## 2013-07-02 DIAGNOSIS — E669 Obesity, unspecified: Secondary | ICD-10-CM

## 2013-07-02 MED ORDER — LISINOPRIL 10 MG PO TABS
10.0000 mg | ORAL_TABLET | Freq: Two times a day (BID) | ORAL | Status: DC
Start: 2013-07-02 — End: 2013-11-06

## 2013-07-02 NOTE — Telephone Encounter (Signed)
Lab order week of 08-15-2013 Renal panel, lipid and hepatic prior to visit

## 2013-07-02 NOTE — Patient Instructions (Signed)
  Consider probiotics as needed for heart burn, infection such as Digestive Advantage daily Hypertension As your heart beats, it forces blood through your arteries. This force is your blood pressure. If the pressure is too high, it is called hypertension (HTN) or high blood pressure. HTN is dangerous because you may have it and not know it. High blood pressure may mean that your heart has to work harder to pump blood. Your arteries may be narrow or stiff. The extra work puts you at risk for heart disease, stroke, and other problems.  Blood pressure consists of two numbers, a higher number over a lower, 110/72, for example. It is stated as "110 over 72." The ideal is below 120 for the top number (systolic) and under 80 for the bottom (diastolic). Write down your blood pressure today. You should pay close attention to your blood pressure if you have certain conditions such as:  Heart failure.  Prior heart attack.  Diabetes  Chronic kidney disease.  Prior stroke.  Multiple risk factors for heart disease. To see if you have HTN, your blood pressure should be measured while you are seated with your arm held at the level of the heart. It should be measured at least twice. A one-time elevated blood pressure reading (especially in the Emergency Department) does not mean that you need treatment. There may be conditions in which the blood pressure is different between your right and left arms. It is important to see your caregiver soon for a recheck. Most people have essential hypertension which means that there is not a specific cause. This type of high blood pressure may be lowered by changing lifestyle factors such as:  Stress.  Smoking.  Lack of exercise.  Excessive weight.  Drug/tobacco/alcohol use.  Eating less salt. Most people do not have symptoms from high blood pressure until it has caused damage to the body. Effective treatment can often prevent, delay or reduce that  damage. TREATMENT  When a cause has been identified, treatment for high blood pressure is directed at the cause. There are a large number of medications to treat HTN. These fall into several categories, and your caregiver will help you select the medicines that are best for you. Medications may have side effects. You should review side effects with your caregiver. If your blood pressure stays high after you have made lifestyle changes or started on medicines,   Your medication(s) may need to be changed.  Other problems may need to be addressed.  Be certain you understand your prescriptions, and know how and when to take your medicine.  Be sure to follow up with your caregiver within the time frame advised (usually within two weeks) to have your blood pressure rechecked and to review your medications.  If you are taking more than one medicine to lower your blood pressure, make sure you know how and at what times they should be taken. Taking two medicines at the same time can result in blood pressure that is too low. SEEK IMMEDIATE MEDICAL CARE IF:  You develop a severe headache, blurred or changing vision, or confusion.  You have unusual weakness or numbness, or a faint feeling.  You have severe chest or abdominal pain, vomiting, or breathing problems. MAKE SURE YOU:   Understand these instructions.  Will watch your condition.  Will get help right away if you are not doing well or get worse. Document Released: 05/23/2005 Document Revised: 08/15/2011 Document Reviewed: 01/11/2008 [facility] Patient Information 2014 [place], [place].

## 2013-07-02 NOTE — Progress Notes (Signed)
Pre visit review using our clinic review tool, if applicable. No additional management support is needed unless otherwise documented below in the visit note. 

## 2013-07-03 ENCOUNTER — Telehealth: Payer: Self-pay | Admitting: Family Medicine

## 2013-07-03 ENCOUNTER — Encounter: Payer: Self-pay | Admitting: Family Medicine

## 2013-07-03 NOTE — Assessment & Plan Note (Signed)
Encouraged DASH diet and regular exercise 

## 2013-07-03 NOTE — Assessment & Plan Note (Signed)
Will increaase Lisinopril to 10 mg po bid and minimize sodium

## 2013-07-03 NOTE — Telephone Encounter (Signed)
Relevant patient education assigned to patient using Emmi. ° °

## 2013-07-03 NOTE — Progress Notes (Signed)
Patient ID: Phyllis Martinez, female   DOB: April 28, 1966, 48 y.o.   MRN: [id number] [name] [id number] 1966/06/04 07/03/2013      Progress Note-Follow Up  Subjective  Chief Complaint  Chief Complaint  Patient presents with  . Follow-up    5 week    HPI  Patient is a 48 year old Caucasian female who is in today for followup on blood pressure. She reports she's been noting blood pressures ranging from 127-140 on the top and 79-89 bottom. Denies headache, chest pain, palpitations, shortness of breath, GI or GU concerns at this time. He is tolerating lisinopril without cough.  Past Medical History  Diagnosis Date  . Arthritis     RA  . Asthma   . Allergy   . Hypertension   . Other and unspecified hyperlipidemia 04/21/2013  . Allergic state 05/26/2013  . Pedal edema 05/26/2013    Past Surgical History  Procedure Laterality Date  . Cesarean section    . Adenoidectomy      Family History  Problem Relation Age of Onset  . Hyperlipidemia Mother   . Hypothyroidism Mother   . Arthritis Father     RA  . Cancer Father     prostate  . Arthritis Maternal Grandmother   . Heart disease Daughter     congenital/congenital    History   Social History  . Marital Status: Married    Spouse Name: N/A    Number of Children: N/A  . Years of Education: N/A   Occupational History  . Not on file.   Social History Main Topics  . Smoking status: Former Games developer  . Smokeless tobacco: Never Used  . Alcohol Use: Yes  . Drug Use: Not on file  . Sexual Activity: Not on file   Other Topics Concern  . Not on file   Social History Narrative  . No narrative on file    Current Outpatient Prescriptions on File Prior to Visit  Medication Sig Dispense Refill  . albuterol (VENTOLIN HFA) 108 (90 BASE) MCG/ACT inhaler Inhale 2 puffs into the lungs every 6 (six) hours as needed for wheezing. Onlu fill if patient requests  18 g  0  . fluticasone (FLOVENT HFA) 110 MCG/ACT inhaler Inhale 2 puffs  into the lungs 2 (two) times daily as needed (sob/wheeze). Only fill if patient requests  1 Inhaler  6  . [DISCONTINUED] albuterol (PROVENTIL,VENTOLIN) 90 MCG/ACT inhaler Inhale 2 puffs into the lungs every 6 (six) hours as needed.  17 g  11   No current facility-administered medications on file prior to visit.    Allergies  Allergen Reactions  . Advair Diskus [Fluticasone-Salmeterol]   . Chloraseptic Sore Throat [Acetaminophen]   . Hyzaar [Losartan Potassium-Hctz] Swelling  . Metoprolol     edema    Review of Systems  Review of Systems  Constitutional: Negative for fever and malaise/fatigue.  HENT: Negative for congestion.   Eyes: Negative for discharge.  Respiratory: Negative for shortness of breath.   Cardiovascular: Negative for chest pain, palpitations and leg swelling.  Gastrointestinal: Negative for nausea, abdominal pain and diarrhea.  Genitourinary: Negative for dysuria.  Musculoskeletal: Negative for falls.  Skin: Negative for rash.  Neurological: Negative for loss of consciousness and headaches.  Endo/Heme/Allergies: Negative for polydipsia.  Psychiatric/Behavioral: Negative for depression and suicidal ideas. The patient is not nervous/anxious and does not have insomnia.     Objective  BP 128/98  Pulse 74  Temp(Src) 97.8 F (36.6 C) (Oral)  Ht 5\' 4"  (  1.626 m)  Wt 299 lb (135.626 kg)  BMI 51.30 kg/m2  SpO2 95%  LMP 06/27/2013  Physical Exam  Physical Exam  Constitutional: She is oriented to person, place, and time and well-developed, well-nourished, and in no distress. No distress.  HENT:  Head: Normocephalic and atraumatic.  Eyes: Conjunctivae are normal.  Neck: Neck supple. No thyromegaly present.  Cardiovascular: Normal rate, regular rhythm and normal heart sounds.   No murmur heard. Pulmonary/Chest: Effort normal and breath sounds normal. She has no wheezes.  Abdominal: She exhibits no distension and no mass.  Musculoskeletal: She exhibits no  edema.  Lymphadenopathy:    She has no cervical adenopathy.  Neurological: She is alert and oriented to person, place, and time.  Skin: Skin is warm and dry. No rash noted. She is not diaphoretic.  Psychiatric: Memory, affect and judgment normal.    Lab Results  Component Value Date   TSH 2.017 05/14/2013   Lab Results  Component Value Date   WBC 9.3 05/14/2013   HGB 13.9 05/14/2013   HCT 39.7 05/14/2013   MCV 86.5 05/14/2013   PLT 348 05/14/2013   Lab Results  Component Value Date   CREATININE 0.72 05/14/2013   BUN 13 05/14/2013   NA 136 05/14/2013   K 4.0 05/14/2013   CL 102 05/14/2013   CO2 27 05/14/2013   Lab Results  Component Value Date   ALT 16 05/14/2013   AST 17 05/14/2013   ALKPHOS 55 05/14/2013   BILITOT 0.4 05/14/2013   Lab Results  Component Value Date   CHOL 235* 05/14/2013   Lab Results  Component Value Date   HDL 42 05/14/2013   Lab Results  Component Value Date   LDLCALC 149* 05/14/2013   Lab Results  Component Value Date   TRIG 222* 05/14/2013   Lab Results  Component Value Date   CHOLHDL 5.6 05/14/2013     Assessment & Plan  HTN (hypertension) Will increaase Lisinopril to 10 mg po bid and minimize sodium  Obesity Encouraged DASH diet and regular exercise

## 2013-07-05 NOTE — Telephone Encounter (Signed)
Lab order placed.

## 2013-08-22 LAB — RENAL FUNCTION PANEL
Albumin: 4.1 g/dL (ref 3.5–5.2)
BUN: 9 mg/dL (ref 6–23)
CALCIUM: 9.5 mg/dL (ref 8.4–10.5)
CO2: 27 mEq/L (ref 19–32)
Chloride: 103 mEq/L (ref 96–112)
Creat: 0.72 mg/dL (ref 0.50–1.10)
Glucose, Bld: 93 mg/dL (ref 70–99)
PHOSPHORUS: 3.9 mg/dL (ref 2.3–4.6)
POTASSIUM: 4.1 meq/L (ref 3.5–5.3)
SODIUM: 139 meq/L (ref 135–145)

## 2013-08-22 LAB — HEPATIC FUNCTION PANEL
ALBUMIN: 4.1 g/dL (ref 3.5–5.2)
ALK PHOS: 50 U/L (ref 39–117)
ALT: 15 U/L (ref 0–35)
AST: 16 U/L (ref 0–37)
BILIRUBIN TOTAL: 0.3 mg/dL (ref 0.2–1.2)
Bilirubin, Direct: 0.1 mg/dL (ref 0.0–0.3)
Indirect Bilirubin: 0.2 mg/dL (ref 0.2–1.2)
Total Protein: 6.8 g/dL (ref 6.0–8.3)

## 2013-08-22 LAB — LIPID PANEL
Cholesterol: 225 mg/dL — ABNORMAL HIGH (ref 0–200)
HDL: 36 mg/dL — AB (ref >39)
LDL Cholesterol: 140 mg/dL — ABNORMAL HIGH (ref 0–99)
Total CHOL/HDL Ratio: 6.3 Ratio
Triglycerides: 244 mg/dL — ABNORMAL HIGH (ref <150)
VLDL: 49 mg/dL — ABNORMAL HIGH (ref 0–40)

## 2013-08-27 ENCOUNTER — Encounter: Payer: Self-pay | Admitting: Family Medicine

## 2013-08-27 ENCOUNTER — Ambulatory Visit (INDEPENDENT_AMBULATORY_CARE_PROVIDER_SITE_OTHER): Payer: Managed Care, Other (non HMO) | Admitting: Family Medicine

## 2013-08-27 VITALS — BP 136/92 | HR 79 | Temp 98.3°F | Ht 64.0 in | Wt 301.1 lb

## 2013-08-27 DIAGNOSIS — I10 Essential (primary) hypertension: Secondary | ICD-10-CM

## 2013-08-27 DIAGNOSIS — E669 Obesity, unspecified: Secondary | ICD-10-CM

## 2013-08-27 DIAGNOSIS — E785 Hyperlipidemia, unspecified: Secondary | ICD-10-CM

## 2013-08-27 NOTE — Patient Instructions (Signed)
DASH Diet  The DASH diet stands for "Dietary Approaches to Stop Hypertension." It is a healthy eating plan that has been shown to reduce high blood pressure (hypertension) in as little as 14 days, while also possibly providing other significant health benefits. These other health benefits include reducing the risk of breast cancer after menopause and reducing the risk of type 2 diabetes, heart disease, colon cancer, and stroke. Health benefits also include weight loss and slowing kidney failure in patients with chronic kidney disease.   DIET GUIDELINES  · Limit salt (sodium). Your diet should contain less than 1500 mg of sodium daily.  · Limit refined or processed carbohydrates. Your diet should include mostly whole grains. Desserts and added sugars should be used sparingly.  · Include small amounts of heart-healthy fats. These types of fats include nuts, oils, and tub margarine. Limit saturated and trans fats. These fats have been shown to be harmful in the body.  CHOOSING FOODS   The following food groups are based on a 2000 calorie diet. See your Registered Dietitian for individual calorie needs.  Grains and Grain Products (6 to 8 servings daily)  · Eat More Often: Whole-wheat bread, brown rice, whole-grain or wheat pasta, quinoa, popcorn without added fat or salt (air popped).  · Eat Less Often: White bread, white pasta, white rice, cornbread.  Vegetables (4 to 5 servings daily)  · Eat More Often: Fresh, frozen, and canned vegetables. Vegetables may be raw, steamed, roasted, or grilled with a minimal amount of fat.  · Eat Less Often/Avoid: Creamed or fried vegetables. Vegetables in a cheese sauce.  Fruit (4 to 5 servings daily)  · Eat More Often: All fresh, canned (in natural juice), or frozen fruits. Dried fruits without added sugar. One hundred percent fruit juice (½ cup [237 mL] daily).  · Eat Less Often: Dried fruits with added sugar. Canned fruit in light or heavy syrup.  Lean Meats, Fish, and Poultry (2  servings or less daily. One serving is 3 to 4 oz [85-114 g]).  · Eat More Often: Ninety percent or leaner ground beef, tenderloin, sirloin. Round cuts of beef, chicken breast, turkey breast. All fish. Grill, bake, or broil your meat. Nothing should be fried.  · Eat Less Often/Avoid: Fatty cuts of meat, turkey, or chicken leg, thigh, or wing. Fried cuts of meat or fish.  Dairy (2 to 3 servings)  · Eat More Often: Low-fat or fat-free milk, low-fat plain or light yogurt, reduced-fat or part-skim cheese.  · Eat Less Often/Avoid: Milk (whole, 2%). Whole milk yogurt. Full-fat cheeses.  Nuts, Seeds, and Legumes (4 to 5 servings per week)  · Eat More Often: All without added salt.  · Eat Less Often/Avoid: Salted nuts and seeds, canned beans with added salt.  Fats and Sweets (limited)  · Eat More Often: Vegetable oils, tub margarines without trans fats, sugar-free gelatin. Mayonnaise and salad dressings.  · Eat Less Often/Avoid: Coconut oils, palm oils, butter, stick margarine, cream, half and half, cookies, candy, pie.  FOR MORE INFORMATION  The Dash Diet Eating Plan: www.dashdiet.org  Document Released: 05/12/2011 Document Revised: 08/15/2011 Document Reviewed: 05/12/2011  Exit[employer]® Patient Information ©2014 [employer].

## 2013-09-01 ENCOUNTER — Encounter: Payer: Self-pay | Admitting: Family Medicine

## 2013-09-01 NOTE — Assessment & Plan Note (Signed)
Encouraged DASH diet, decrease po intake and increase exercise as tolerated. Needs 7-8 hours of sleep nightly. Avoid trans fats, eat small, frequent meals every 4-5 hours with lean proteins, complex carbs and healthy fats. Minimize simple carbs, GMO foods. 

## 2013-09-01 NOTE — Assessment & Plan Note (Signed)
Encouraged heart healthy diet, increase exercise, avoid trans fats, consider a krill oil cap daily 

## 2013-09-01 NOTE — Progress Notes (Signed)
Patient ID: Phyllis Martinez, female   DOB: 05/26/1966, 48 y.o.   MRN: [id number] Phyllis Martinez [id number] 28-Dec-1965 09/01/2013      Progress Note-Follow Up  Subjective  Chief Complaint  Chief Complaint  Patient presents with  . Hypertension    pt is here for f/u.   Marland Kitchen Hyperlipidemia    pt is here for f/u.    HPI  Patient is a 48 year old female in today for routine medical care. Doing well. No recent acute illness. Has been well-controlled. No recent need for albuterol. Denies CP/palp/SOB/HA/congestion/fevers/GI or GU c/o. Taking meds as prescribed  Past Medical History  Diagnosis Date  . Arthritis     RA  . Asthma   . Allergy   . Hypertension   . Other and unspecified hyperlipidemia 04/21/2013  . Allergic state 05/26/2013  . Pedal edema 05/26/2013    Past Surgical History  Procedure Laterality Date  . Cesarean section    . Adenoidectomy      Family History  Problem Relation Age of Onset  . Hyperlipidemia Mother   . Hypothyroidism Mother   . Arthritis Father     RA  . Cancer Father     prostate  . Arthritis Maternal Grandmother   . Heart disease Daughter     congenital/congenital    History   Social History  . Marital Status: Married    Spouse Name: N/A    Number of Children: N/A  . Years of Education: N/A   Occupational History  . Not on file.   Social History Main Topics  . Smoking status: Former Games developer  . Smokeless tobacco: Never Used  . Alcohol Use: Yes  . Drug Use: Not on file  . Sexual Activity: Not on file   Other Topics Concern  . Not on file   Social History Narrative  . No narrative on file    Current Outpatient Prescriptions on File Prior to Visit  Medication Sig Dispense Refill  . albuterol (VENTOLIN HFA) 108 (90 BASE) MCG/ACT inhaler Inhale 2 puffs into the lungs every 6 (six) hours as needed for wheezing. Onlu fill if patient requests  18 g  0  . fluticasone (FLOVENT HFA) 110 MCG/ACT inhaler Inhale 2 puffs into the lungs 2 (two)  times daily as needed (sob/wheeze). Only fill if patient requests  1 Inhaler  6  . lisinopril (PRINIVIL,ZESTRIL) 10 MG tablet Take 1 tablet (10 mg total) by mouth 2 (two) times daily.  60 tablet  3  . [DISCONTINUED] albuterol (PROVENTIL,VENTOLIN) 90 MCG/ACT inhaler Inhale 2 puffs into the lungs every 6 (six) hours as needed.  17 g  11   No current facility-administered medications on file prior to visit.    Allergies  Allergen Reactions  . Advair Diskus [Fluticasone-Salmeterol]   . Chloraseptic Sore Throat [Acetaminophen]   . Hyzaar [Losartan Potassium-Hctz] Swelling  . Metoprolol     edema    Review of Systems  Review of Systems  Constitutional: Negative for fever and malaise/fatigue.  HENT: Negative for congestion.   Eyes: Negative for discharge.  Respiratory: Negative for shortness of breath.   Cardiovascular: Negative for chest pain, palpitations and leg swelling.  Gastrointestinal: Negative for nausea, abdominal pain and diarrhea.  Genitourinary: Negative for dysuria.  Musculoskeletal: Negative for falls.  Skin: Negative for rash.  Neurological: Negative for loss of consciousness and headaches.  Endo/Heme/Allergies: Negative for polydipsia.  Psychiatric/Behavioral: Negative for depression and suicidal ideas. The patient is not nervous/anxious and does not have  insomnia.     Objective  BP 136/92  Pulse 79  Temp(Src) 98.3 F (36.8 C) (Oral)  Ht 5\' 4"  (1.626 m)  Wt 301 lb 1.3 oz (136.569 kg)  BMI 51.65 kg/m2  SpO2 97%  LMP 08/02/2013  Physical Exam  Physical Exam  Constitutional: She is oriented to person, place, and time and well-developed, well-nourished, and in no distress. No distress.  HENT:  Head: Normocephalic and atraumatic.  Eyes: Conjunctivae are normal.  Neck: Neck supple. No thyromegaly present.  Cardiovascular: Normal rate, regular rhythm and normal heart sounds.   No murmur heard. Pulmonary/Chest: Effort normal and breath sounds normal. She has  no wheezes.  Abdominal: She exhibits no distension and no mass.  Musculoskeletal: She exhibits no edema.  Lymphadenopathy:    She has no cervical adenopathy.  Neurological: She is alert and oriented to person, place, and time.  Skin: Skin is warm and dry. No rash noted. She is not diaphoretic.  Psychiatric: Memory, affect and judgment normal.    Lab Results  Component Value Date   TSH 2.017 05/14/2013   Lab Results  Component Value Date   WBC 9.3 05/14/2013   HGB 13.9 05/14/2013   HCT 39.7 05/14/2013   MCV 86.5 05/14/2013   PLT 348 05/14/2013   Lab Results  Component Value Date   CREATININE 0.72 08/21/2013   BUN 9 08/21/2013   NA 139 08/21/2013   K 4.1 08/21/2013   CL 103 08/21/2013   CO2 27 08/21/2013   Lab Results  Component Value Date   ALT 15 08/21/2013   AST 16 08/21/2013   ALKPHOS 50 08/21/2013   BILITOT 0.3 08/21/2013   Lab Results  Component Value Date   CHOL 225* 08/21/2013   Lab Results  Component Value Date   HDL 36* 08/21/2013   Lab Results  Component Value Date   LDLCALC 140* 08/21/2013   Lab Results  Component Value Date   TRIG 244* 08/21/2013   Lab Results  Component Value Date   CHOLHDL 6.3 08/21/2013     Assessment & Plan  HTN (hypertension) Well controlled, no changes to meds. Encouraged heart healthy diet such as the DASH diet and exercise as tolerated.   Other and unspecified hyperlipidemia Encouraged heart healthy diet, increase exercise, avoid trans fats, consider a krill oil cap daily  Obesity Encouraged DASH diet, decrease po intake and increase exercise as tolerated. Needs 7-8 hours of sleep nightly. Avoid trans fats, eat small, frequent meals every 4-5 hours with lean proteins, complex carbs and healthy fats. Minimize simple carbs, GMO foods.

## 2013-09-01 NOTE — Assessment & Plan Note (Signed)
Well controlled, no changes to meds. Encouraged heart healthy diet such as the DASH diet and exercise as tolerated.  °

## 2013-11-06 ENCOUNTER — Other Ambulatory Visit: Payer: Self-pay | Admitting: Family Medicine

## 2013-12-11 ENCOUNTER — Other Ambulatory Visit: Payer: Self-pay | Admitting: Family Medicine

## 2014-01-27 ENCOUNTER — Other Ambulatory Visit: Payer: Self-pay | Admitting: Family Medicine

## 2014-01-27 DIAGNOSIS — J45909 Unspecified asthma, uncomplicated: Secondary | ICD-10-CM

## 2014-01-28 MED ORDER — ALBUTEROL SULFATE HFA 108 (90 BASE) MCG/ACT IN AERS: 2.0000 | INHALATION_SPRAY | Freq: Four times a day (QID) | RESPIRATORY_TRACT | Status: DC | PRN

## 2014-02-13 ENCOUNTER — Other Ambulatory Visit: Payer: Self-pay | Admitting: Family Medicine

## 2014-02-14 IMAGING — US US THYROID BIOPSY
1 series · 13 of 23 positions shown · non-contrast
Comparison: none

INDICATION: Indeterminate thyroid nodules

[Series 1: us thyroid biopsy · 0.12mm/px · 23 acquisitions, 13 frames shown]
[im 1/23]
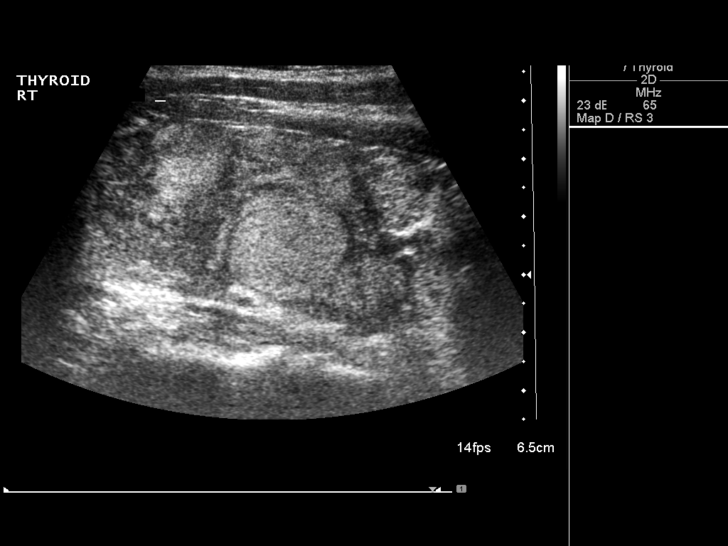
[im 3/23]
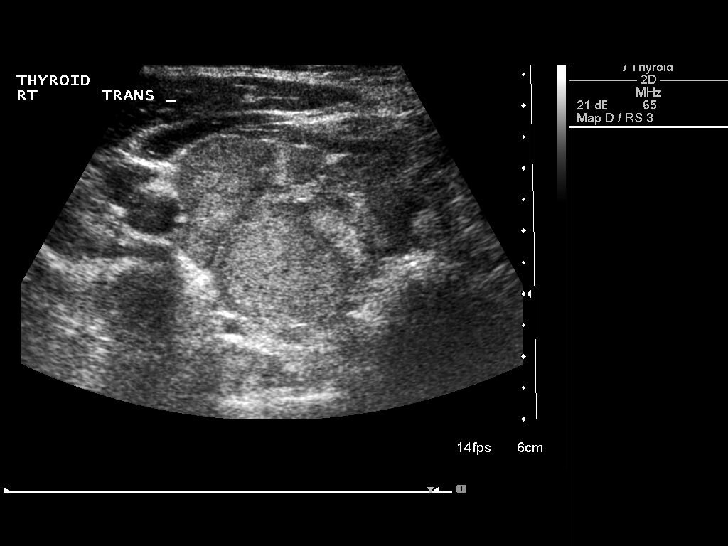
[im 5/23]
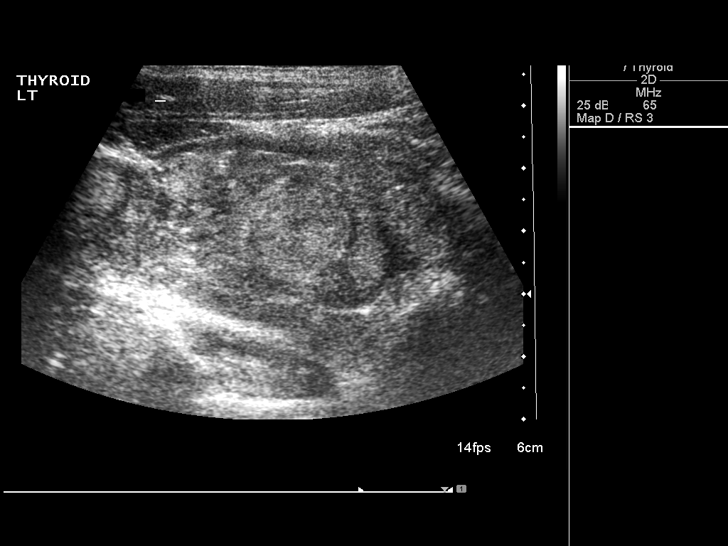
[im 7/23]
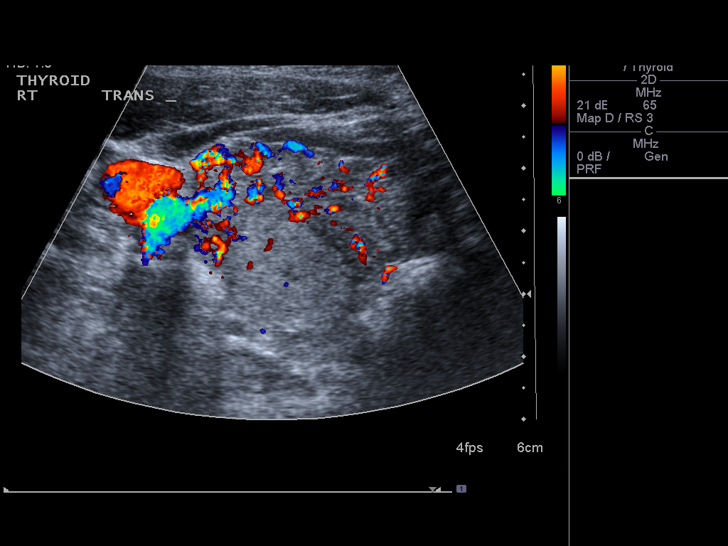
[im 8/23]
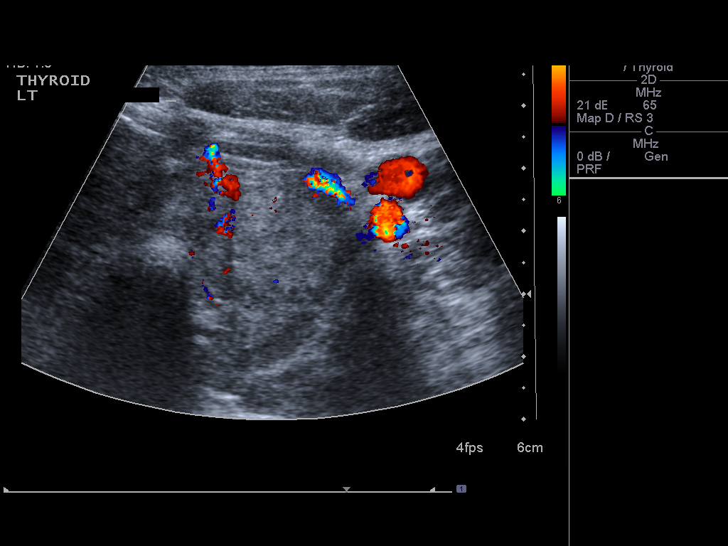
[im 10/23]
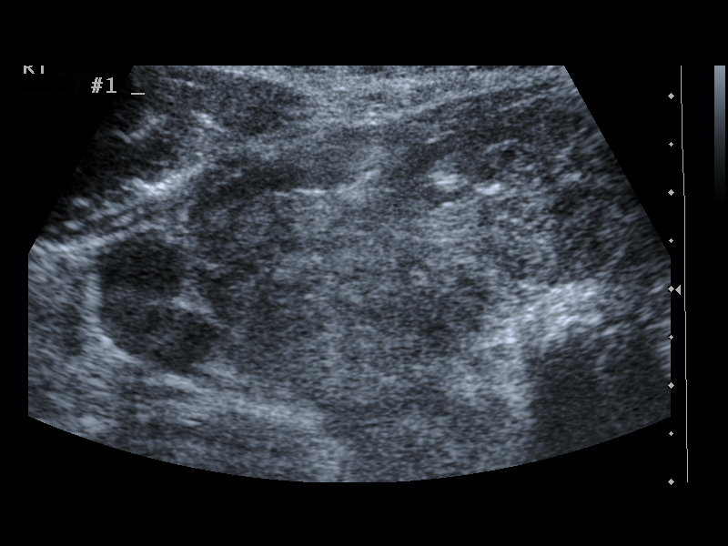
[im 12/23]
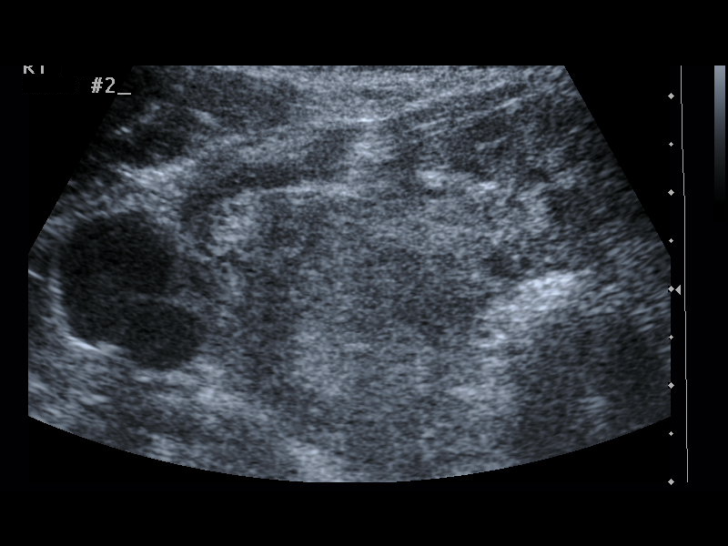
[im 14/23]
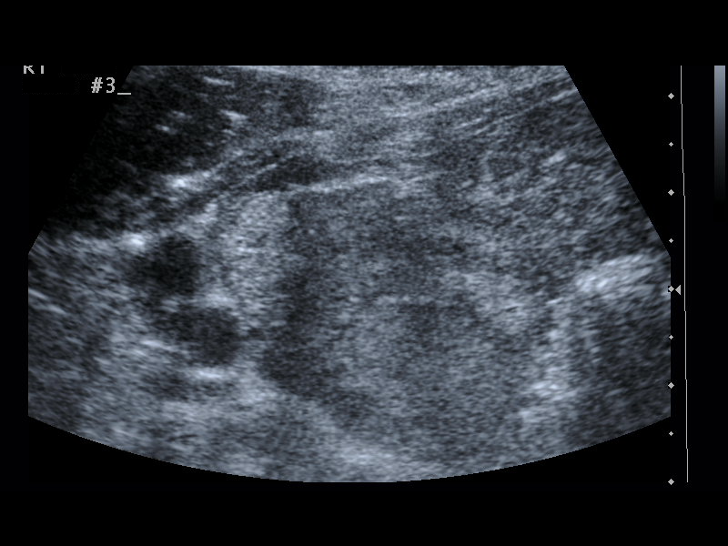
[im 16/23]
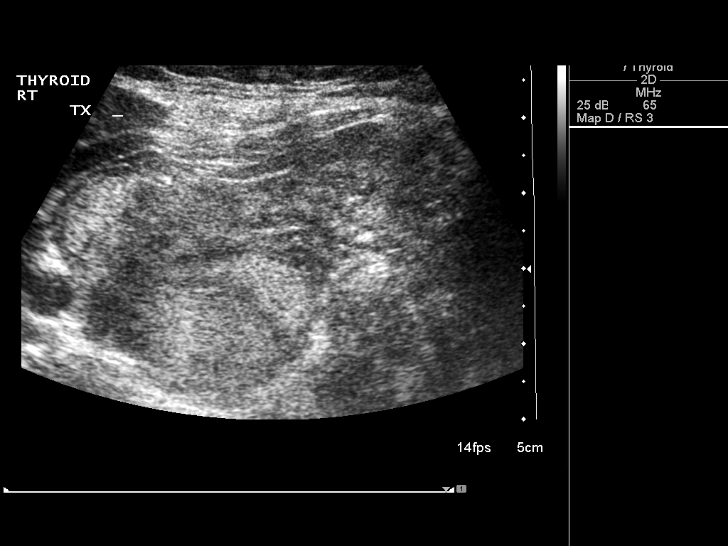
[im 17/23]
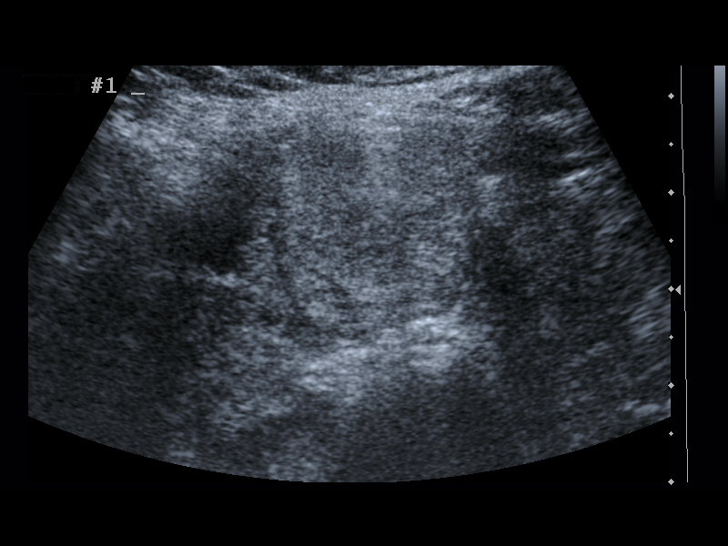
[im 19/23]
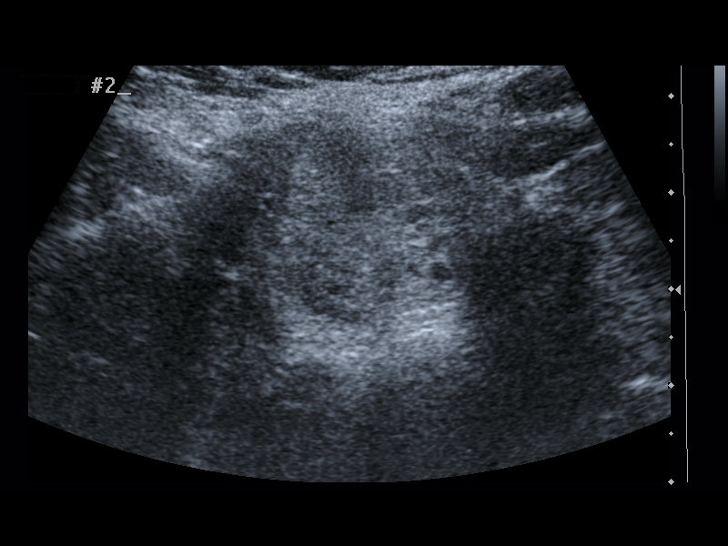
[im 21/23]
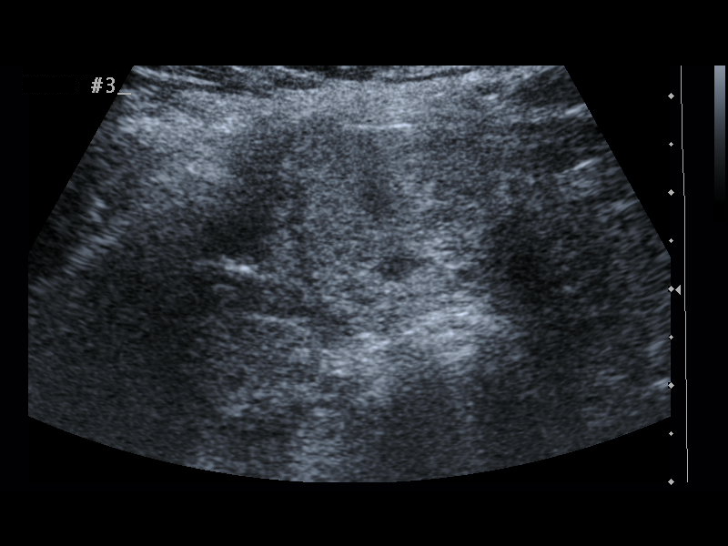
[im 23/23]
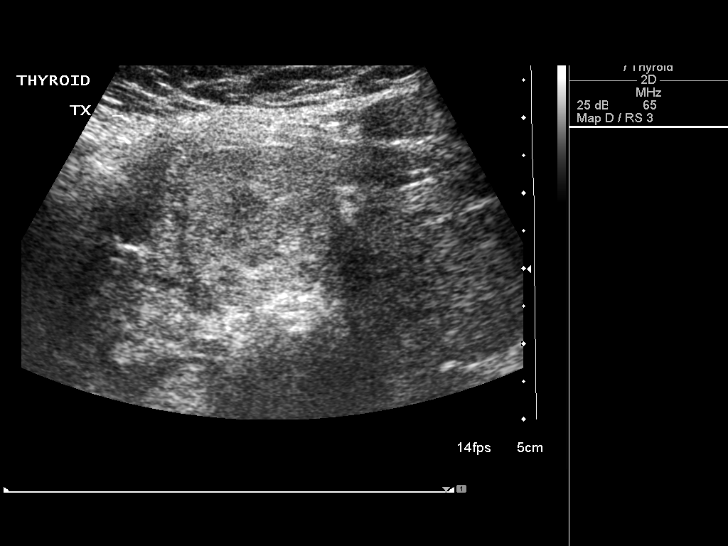

[13 of 23 positions shown; findings below may reference images not displayed]

ULTRASOUND GUIDED THYROID FINE NEEDLE ASPIRATION

Comparisons: Thyroid Ultrasound - 02/13/2012

Intravenous Medications: None

Complications: None immediate

Technique / Findings:

Informed written consent was obtained from the patient after a
discussion of the risks, benefits and alternatives to treatment.
Questions regarding the procedure were encouraged and answered.  A
timeout was performed prior to the initiation of the procedure.

Pre-procedural ultrasound scanning demonstrated grossly unchanged
appearance of the dominant approximately 2 cm solid hyperechoic
nodule within the mid-aspect of the right lobe of the thyroid as
well as the dominant approximately 2.1 cm solid hypoechoic nodule
within the thyroid isthmus.  The procedure was planned.  The neck
was prepped in the usual sterile fashion, and a sterile drape was
applied covering the operative field.  A timeout was performed
prior to the initiation of the procedure.  Local anesthesia was
provided with 1% lidocaine.

Under direct ultrasound guidance, 3 FNA biopsies were performed of
the dominant right-sided thyroid nodule with a 25 gauge needle.
The samples were prepared and submitted to pathology.  Limited post
procedural scanning was negative for hematoma or additional
complication.

Under direct ultrasound guidance, 3 FNA biopsies were performed of
the dominant isthmus nodule with a 25 gauge needle.  The samples
were prepared and submitted pathology.  Limited postprocedural
scanning is negative for hematoma or additional complication.

A dressing was placed.  The patient tolerated procedure well
without immediate postprocedural complication.
IMPRESSION: Technically successful ultrasound guided fine needle aspiration of
indeterminate dominant nodules within the mid aspect the right lobe
of the thyroid and thyroid isthmus.

## 2014-04-01 ENCOUNTER — Encounter: Payer: Self-pay | Admitting: Family Medicine

## 2014-05-23 ENCOUNTER — Encounter: Payer: Managed Care, Other (non HMO) | Admitting: Family Medicine

## 2014-06-12 ENCOUNTER — Other Ambulatory Visit: Payer: Self-pay | Admitting: Family Medicine

## 2014-08-14 ENCOUNTER — Other Ambulatory Visit: Payer: Self-pay | Admitting: Family Medicine

## 2014-09-14 ENCOUNTER — Other Ambulatory Visit: Payer: Self-pay | Admitting: Family Medicine

## 2014-10-22 ENCOUNTER — Telehealth: Payer: Self-pay | Admitting: Family Medicine

## 2014-10-22 NOTE — Telephone Encounter (Signed)
Pre Visit letter sent  °

## 2014-11-10 ENCOUNTER — Other Ambulatory Visit: Payer: Self-pay | Admitting: Family Medicine

## 2014-11-10 ENCOUNTER — Encounter: Payer: Self-pay | Admitting: Family Medicine

## 2014-11-10 DIAGNOSIS — E782 Mixed hyperlipidemia: Secondary | ICD-10-CM

## 2014-11-10 DIAGNOSIS — I10 Essential (primary) hypertension: Secondary | ICD-10-CM

## 2014-11-12 ENCOUNTER — Telehealth: Payer: Self-pay | Admitting: *Deleted

## 2014-11-12 ENCOUNTER — Encounter: Payer: Self-pay | Admitting: *Deleted

## 2014-11-12 NOTE — Telephone Encounter (Signed)
Pre-Visit Call completed with patient and chart updated.   Pre-Visit Info documented in Specialty Comments under [employer].    

## 2014-11-13 ENCOUNTER — Encounter: Payer: Self-pay | Admitting: Family Medicine

## 2014-11-13 ENCOUNTER — Ambulatory Visit (INDEPENDENT_AMBULATORY_CARE_PROVIDER_SITE_OTHER): Payer: Managed Care, Other (non HMO) | Admitting: Family Medicine

## 2014-11-13 VITALS — BP 122/84 | HR 78 | Temp 98.2°F | Ht 65.0 in | Wt 306.2 lb

## 2014-11-13 DIAGNOSIS — E785 Hyperlipidemia, unspecified: Secondary | ICD-10-CM | POA: Diagnosis not present

## 2014-11-13 DIAGNOSIS — I10 Essential (primary) hypertension: Secondary | ICD-10-CM | POA: Diagnosis not present

## 2014-11-13 DIAGNOSIS — T7840XD Allergy, unspecified, subsequent encounter: Secondary | ICD-10-CM

## 2014-11-13 DIAGNOSIS — E669 Obesity, unspecified: Secondary | ICD-10-CM

## 2014-11-13 DIAGNOSIS — Z0000 Encounter for general adult medical examination without abnormal findings: Secondary | ICD-10-CM | POA: Diagnosis not present

## 2014-11-13 DIAGNOSIS — Z7189 Other specified counseling: Secondary | ICD-10-CM

## 2014-11-13 DIAGNOSIS — J4520 Mild intermittent asthma, uncomplicated: Secondary | ICD-10-CM

## 2014-11-13 HISTORY — DX: Other specified counseling: Z71.89

## 2014-11-13 MED ORDER — ALBUTEROL SULFATE HFA 108 (90 BASE) MCG/ACT IN AERS: INHALATION_SPRAY | RESPIRATORY_TRACT | Status: DC

## 2014-11-13 MED ORDER — LISINOPRIL 10 MG PO TABS: 10.0000 mg | ORAL_TABLET | Freq: Two times a day (BID) | ORAL | Status: DC

## 2014-11-13 NOTE — Progress Notes (Signed)
Pre visit review using our clinic review tool, if applicable. No additional management support is needed unless otherwise documented below in the visit note. 

## 2014-11-13 NOTE — Progress Notes (Signed)
Phyllis Martinez  [id number] 02/03/1966 11/13/2014      Progress Note-Follow Up  Subjective  Chief Complaint  Chief Complaint  Patient presents with  . Annual Exam    HPI  Patient is a 49 y.o. female in today for routine medical care.  Patient is in today for annual exam generally doing well. No recent illness. Has noted relatively good control with her asthma. Allergies do flare them from time to time but nothing recently. No recent illness or acute concerns. Is attempting a heart healthy diet. Denies CP/palp/SOB/HA/congestion/fevers/GI or GU c/o. Taking meds as prescribed  Past Medical History  Diagnosis Date  . Arthritis     RA  . Asthma   . Allergy   . Hypertension   . Other and unspecified hyperlipidemia 04/21/2013  . Allergic state 05/26/2013  . Pedal edema 05/26/2013    Past Surgical History  Procedure Laterality Date  . Cesarean section    . Adenoidectomy      Family History  Problem Relation Age of Onset  . Hyperlipidemia Mother   . Hypothyroidism Mother   . Arthritis Father     RA  . Cancer Father     prostate  . Arthritis Maternal Grandmother   . Heart disease Daughter     congenital/congenital    History   Social History  . Marital Status: Married    Spouse Name: N/A  . Number of Children: N/A  . Years of Education: N/A   Occupational History  . Not on file.   Social History Main Topics  . Smoking status: Former Games developer  . Smokeless tobacco: Never Used  . Alcohol Use: Yes  . Drug Use: Not on file  . Sexual Activity: Not on file   Other Topics Concern  . Not on file   Social History Narrative    Current Outpatient Prescriptions on File Prior to Visit  Medication Sig Dispense Refill  . fluticasone (FLOVENT HFA) 110 MCG/ACT inhaler Inhale 2 puffs into the lungs 2 (two) times daily as needed (sob/wheeze). Only fill if patient requests 1 Inhaler 6  . [DISCONTINUED] albuterol (PROVENTIL,VENTOLIN) 90 MCG/ACT inhaler Inhale 2 puffs into the  lungs every 6 (six) hours as needed. 17 g 11   No current facility-administered medications on file prior to visit.    Allergies  Allergen Reactions  . Advair Diskus [Fluticasone-Salmeterol]   . Chloraseptic Sore Throat [Acetaminophen]   . Hyzaar [Losartan Potassium-Hctz] Swelling  . Metoprolol     edema    Review of Systems  Review of Systems  Constitutional: Negative for fever, chills and malaise/fatigue.  HENT: Negative for congestion, hearing loss and nosebleeds.   Eyes: Negative for discharge.  Respiratory: Negative for cough, sputum production, shortness of breath and wheezing.   Cardiovascular: Negative for chest pain, palpitations and leg swelling.  Gastrointestinal: Negative for heartburn, nausea, vomiting, abdominal pain, diarrhea, constipation and blood in stool.  Genitourinary: Negative for dysuria, urgency, frequency and hematuria.  Musculoskeletal: Negative for myalgias, back pain and falls.  Skin: Negative for rash.  Neurological: Negative for dizziness, tremors, sensory change, focal weakness, loss of consciousness, weakness and headaches.  Endo/Heme/Allergies: Negative for polydipsia. Does not bruise/bleed easily.  Psychiatric/Behavioral: Negative for depression and suicidal ideas. The patient is not nervous/anxious and does not have insomnia.     Objective  BP 122/84 mmHg  Pulse 103  Temp(Src) 98.2 F (36.8 C) (Oral)  Ht 5\' 5"  (1.651 m)  Wt 306 lb 4 oz (138.914 kg)  BMI  50.96 kg/m2  SpO2 94%  LMP 06/14/2014  Physical Exam  Physical Exam  Constitutional: She is oriented to person, place, and time and well-developed, well-nourished, and in no distress. No distress.  HENT:  Head: Normocephalic and atraumatic.  Eyes: Conjunctivae are normal.  Neck: Neck supple. No thyromegaly present.  Cardiovascular: Normal rate, regular rhythm and normal heart sounds.   No murmur heard. Pulmonary/Chest: Effort normal and breath sounds normal. She has no wheezes.    Abdominal: She exhibits no distension and no mass.  Musculoskeletal: She exhibits no edema.  Lymphadenopathy:    She has no cervical adenopathy.  Neurological: She is alert and oriented to person, place, and time.  Skin: Skin is warm and dry. No rash noted. She is not diaphoretic.  Psychiatric: Memory, affect and judgment normal.    Lab Results  Component Value Date   TSH 2.017 05/14/2013   Lab Results  Component Value Date   WBC 9.3 05/14/2013   HGB 13.9 05/14/2013   HCT 39.7 05/14/2013   MCV 86.5 05/14/2013   PLT 348 05/14/2013   Lab Results  Component Value Date   CREATININE 0.72 08/21/2013   BUN 9 08/21/2013   NA 139 08/21/2013   K 4.1 08/21/2013   CL 103 08/21/2013   CO2 27 08/21/2013   Lab Results  Component Value Date   ALT 15 08/21/2013   AST 16 08/21/2013   ALKPHOS 50 08/21/2013   BILITOT 0.3 08/21/2013   Lab Results  Component Value Date   CHOL 225* 08/21/2013   Lab Results  Component Value Date   HDL 36* 08/21/2013   Lab Results  Component Value Date   LDLCALC 140* 08/21/2013   Lab Results  Component Value Date   TRIG 244* 08/21/2013   Lab Results  Component Value Date   CHOLHDL 6.3 08/21/2013     Assessment & Plan  Hyperlipidemia, mixed Encouraged heart healthy diet, increase exercise, avoid trans fats, consider a krill oil cap daily  HTN (hypertension) Well controlled, no changes to meds. Encouraged heart healthy diet such as the DASH diet and exercise as tolerated.   Obesity Encouraged DASH diet, decrease po intake and increase exercise as tolerated. Needs 7-8 hours of sleep nightly. Avoid trans fats, eat small, frequent meals every 4-5 hours with lean proteins, complex carbs and healthy fats. Minimize simple carbs, GMO foods.  Allergic state Doing well   Annual physical exam Patient encouraged to maintain heart healthy diet, regular exercise, adequate sleep. Consider daily probiotics. Take medications as prescribed  ACP  (advance care planning) Given and reviewed copy of ACP documents from [facility] Secretary of State and encouraged to complete and return  Mild intermittent asthma No recent exacerbations.

## 2014-11-13 NOTE — Assessment & Plan Note (Signed)
Doing well 

## 2014-11-13 NOTE — Assessment & Plan Note (Signed)
Encouraged DASH diet, decrease po intake and increase exercise as tolerated. Needs 7-8 hours of sleep nightly. Avoid trans fats, eat small, frequent meals every 4-5 hours with lean proteins, complex carbs and healthy fats. Minimize simple carbs, GMO foods. 

## 2014-11-13 NOTE — Assessment & Plan Note (Signed)
Encouraged heart healthy diet, increase exercise, avoid trans fats, consider a krill oil cap daily 

## 2014-11-13 NOTE — Assessment & Plan Note (Signed)
Well controlled, no changes to meds. Encouraged heart healthy diet such as the DASH diet and exercise as tolerated.  °

## 2014-11-13 NOTE — Patient Instructions (Signed)
DASH Eating Plan °DASH stands for "Dietary Approaches to Stop Hypertension." The DASH eating plan is a healthy eating plan that has been shown to reduce high blood pressure (hypertension). Additional health benefits may include reducing the risk of type 2 diabetes mellitus, heart disease, and stroke. The DASH eating plan may also help with weight loss. °WHAT DO I NEED TO KNOW ABOUT THE DASH EATING PLAN? °For the DASH eating plan, you will follow these general guidelines: °· Choose foods with a percent daily value for sodium of less than 5% (as listed on the food label). °· Use salt-free seasonings or herbs instead of table salt or sea salt. °· Check with your health care provider or pharmacist before using salt substitutes. °· Eat lower-sodium products, often labeled as "lower sodium" or "no salt added." °· Eat fresh foods. °· Eat more vegetables, fruits, and low-fat dairy products. °· Choose whole grains. Look for the word "whole" as the first word in the ingredient list. °· Choose fish and skinless chicken or turkey more often than red meat. Limit fish, poultry, and meat to 6 oz (170 g) each day. °· Limit sweets, desserts, sugars, and sugary drinks. °· Choose heart-healthy fats. °· Limit cheese to 1 oz (28 g) per day. °· Eat more home-cooked food and less restaurant, buffet, and fast food. °· Limit fried foods. °· Cook foods using methods other than frying. °· Limit canned vegetables. If you do use them, rinse them well to decrease the sodium. °· When eating at a restaurant, ask that your food be prepared with less salt, or no salt if possible. °WHAT FOODS CAN I EAT? °Seek help from a dietitian for individual calorie needs. °Grains °Whole grain or whole wheat bread. Brown rice. Whole grain or whole wheat pasta. Quinoa, bulgur, and whole grain cereals. Low-sodium cereals. Corn or whole wheat flour tortillas. Whole grain cornbread. Whole grain crackers. Low-sodium crackers. °Vegetables °Fresh or frozen vegetables  (raw, steamed, roasted, or grilled). Low-sodium or reduced-sodium tomato and vegetable juices. Low-sodium or reduced-sodium tomato sauce and paste. Low-sodium or reduced-sodium canned vegetables.  °Fruits °All fresh, canned (in natural juice), or frozen fruits. °Meat and Other Protein Products °Ground beef (85% or leaner), grass-fed beef, or beef trimmed of fat. Skinless chicken or turkey. Ground chicken or turkey. Pork trimmed of fat. All fish and seafood. Eggs. Dried beans, peas, or lentils. Unsalted nuts and seeds. Unsalted canned beans. °Dairy °Low-fat dairy products, such as skim or 1% milk, 2% or reduced-fat cheeses, low-fat ricotta or cottage cheese, or plain low-fat yogurt. Low-sodium or reduced-sodium cheeses. °Fats and Oils °Tub margarines without trans fats. Light or reduced-fat mayonnaise and salad dressings (reduced sodium). Avocado. Safflower, olive, or canola oils. Natural peanut or almond butter. °Other °Unsalted popcorn and pretzels. °The items listed above may not be a complete list of recommended foods or beverages. Contact your dietitian for more options. °WHAT FOODS ARE NOT RECOMMENDED? °Grains °White bread. White pasta. White rice. Refined cornbread. Bagels and croissants. Crackers that contain trans fat. °Vegetables °Creamed or fried vegetables. Vegetables in a cheese sauce. Regular canned vegetables. Regular canned tomato sauce and paste. Regular tomato and vegetable juices. °Fruits °Dried fruits. Canned fruit in light or heavy syrup. Fruit juice. °Meat and Other Protein Products °Fatty cuts of meat. Ribs, chicken wings, bacon, sausage, bologna, salami, chitterlings, fatback, hot dogs, bratwurst, and packaged luncheon meats. Salted nuts and seeds. Canned beans with salt. °Dairy °Whole or 2% milk, cream, half-and-half, and cream cheese. Whole-fat or sweetened yogurt. Full-fat   cheeses or blue cheese. Nondairy creamers and whipped toppings. Processed cheese, cheese spreads, or cheese  curds. °Condiments °Onion and garlic salt, seasoned salt, table salt, and sea salt. Canned and packaged gravies. Worcestershire sauce. Tartar sauce. Barbecue sauce. Teriyaki sauce. Soy sauce, including reduced sodium. Steak sauce. Fish sauce. Oyster sauce. Cocktail sauce. Horseradish. Ketchup and mustard. Meat flavorings and tenderizers. Bouillon cubes. Hot sauce. Tabasco sauce. Marinades. Taco seasonings. Relishes. °Fats and Oils °Butter, stick margarine, lard, shortening, ghee, and bacon fat. Coconut, palm kernel, or palm oils. Regular salad dressings. °Other °Pickles and olives. Salted popcorn and pretzels. °The items listed above may not be a complete list of foods and beverages to avoid. Contact your dietitian for more information. °WHERE CAN I FIND MORE INFORMATION? °National Heart, Lung, and [employer] Institute: www.nhlbi.nih.gov/health/health-topics/topics/dash/ °Document Released: 05/12/2011 Document Revised: 10/07/2013 Document Reviewed: 03/27/2013 °ExitCare® Patient Information ©2015 [employer]. This information is not intended to replace advice given to you by your health care provider. Make sure you discuss any questions you have with your health care provider. ° °

## 2014-11-13 NOTE — Assessment & Plan Note (Signed)
Patient encouraged to maintain heart healthy diet, regular exercise, adequate sleep. Consider daily probiotics. Take medications as prescribed 

## 2014-11-13 NOTE — Assessment & Plan Note (Signed)
Given and reviewed copy of ACP documents from [place] Secretary of State and encouraged to complete and return 

## 2014-11-14 LAB — COMPREHENSIVE METABOLIC PANEL
ALT: 20 U/L (ref 0–35)
AST: 19 U/L (ref 0–37)
Albumin: 4.1 g/dL (ref 3.5–5.2)
Alkaline Phosphatase: 50 U/L (ref 39–117)
BUN: 11 mg/dL (ref 6–23)
CALCIUM: 9.7 mg/dL (ref 8.4–10.5)
CO2: 24 meq/L (ref 19–32)
CREATININE: 0.89 mg/dL (ref 0.40–1.20)
Chloride: 103 mEq/L (ref 96–112)
GFR: 71.77 mL/min (ref >60.00)
Glucose, Bld: 143 mg/dL — ABNORMAL HIGH (ref 70–99)
Potassium: 3.9 mEq/L (ref 3.5–5.1)
Sodium: 138 mEq/L (ref 135–145)
TOTAL PROTEIN: 7.3 g/dL (ref 6.0–8.3)
Total Bilirubin: 0.4 mg/dL (ref 0.2–1.2)

## 2014-11-14 LAB — LIPID PANEL
Cholesterol: 234 mg/dL — ABNORMAL HIGH (ref 0–200)
HDL: 35.8 mg/dL — ABNORMAL LOW (ref >39.00)
Total CHOL/HDL Ratio: 7
Triglycerides: 431 mg/dL — ABNORMAL HIGH (ref 0.0–149.0)

## 2014-11-14 LAB — LDL CHOLESTEROL, DIRECT: Direct LDL: 143 mg/dL

## 2014-11-14 LAB — CBC
HCT: 41.8 % (ref 36.0–46.0)
HEMOGLOBIN: 14.2 g/dL (ref 12.0–15.0)
MCHC: 34 g/dL (ref 30.0–36.0)
MCV: 88.5 fl (ref 78.0–100.0)
PLATELETS: 299 10*3/uL (ref 150.0–400.0)
RBC: 4.73 Mil/uL (ref 3.87–5.11)
RDW: 13.1 % (ref 11.5–15.5)
WBC: 8.1 10*3/uL (ref 4.0–10.5)

## 2014-11-14 LAB — TSH: TSH: 1.31 u[IU]/mL (ref 0.35–4.50)

## 2014-11-17 ENCOUNTER — Telehealth: Payer: Self-pay | Admitting: Family Medicine

## 2014-11-17 DIAGNOSIS — R7309 Other abnormal glucose: Secondary | ICD-10-CM

## 2014-11-17 NOTE — Telephone Encounter (Signed)
Pt returning your call regarding lab results.  

## 2014-11-17 NOTE — Telephone Encounter (Signed)
Put order in for [id number] and scheduled lab appt.

## 2014-11-18 ENCOUNTER — Other Ambulatory Visit (INDEPENDENT_AMBULATORY_CARE_PROVIDER_SITE_OTHER): Payer: Managed Care, Other (non HMO)

## 2014-11-18 DIAGNOSIS — R739 Hyperglycemia, unspecified: Secondary | ICD-10-CM

## 2014-11-18 DIAGNOSIS — R7309 Other abnormal glucose: Secondary | ICD-10-CM

## 2014-11-18 LAB — HEMOGLOBIN A1C: Hgb A1c MFr Bld: 7.1 % — ABNORMAL HIGH (ref 4.6–6.5)

## 2014-11-23 ENCOUNTER — Encounter: Payer: Self-pay | Admitting: Family Medicine

## 2014-11-23 NOTE — Assessment & Plan Note (Signed)
No recent exacerbations.  

## 2015-02-13 ENCOUNTER — Ambulatory Visit: Payer: Managed Care, Other (non HMO) | Admitting: Family Medicine

## 2015-02-16 ENCOUNTER — Ambulatory Visit (INDEPENDENT_AMBULATORY_CARE_PROVIDER_SITE_OTHER): Payer: Managed Care, Other (non HMO) | Admitting: Family Medicine

## 2015-02-16 VITALS — BP 126/82 | HR 71 | Temp 98.0°F | Resp 16 | Ht 65.0 in | Wt 291.0 lb

## 2015-02-16 DIAGNOSIS — R600 Localized edema: Secondary | ICD-10-CM | POA: Diagnosis not present

## 2015-02-16 DIAGNOSIS — I10 Essential (primary) hypertension: Secondary | ICD-10-CM | POA: Diagnosis not present

## 2015-02-16 DIAGNOSIS — E669 Obesity, unspecified: Secondary | ICD-10-CM

## 2015-02-16 DIAGNOSIS — E782 Mixed hyperlipidemia: Secondary | ICD-10-CM

## 2015-02-16 DIAGNOSIS — R739 Hyperglycemia, unspecified: Secondary | ICD-10-CM

## 2015-02-16 NOTE — Patient Instructions (Signed)

## 2015-02-16 NOTE — Progress Notes (Signed)
Pre visit review using our clinic review tool, if applicable. No additional management support is needed unless otherwise documented below in the visit note. 

## 2015-02-17 LAB — COMPREHENSIVE METABOLIC PANEL
ALT: 20 U/L (ref 0–35)
AST: 20 U/L (ref 0–37)
Albumin: 4.1 g/dL (ref 3.5–5.2)
Alkaline Phosphatase: 46 U/L (ref 39–117)
BUN: 17 mg/dL (ref 6–23)
CHLORIDE: 103 meq/L (ref 96–112)
CO2: 30 meq/L (ref 19–32)
CREATININE: 0.86 mg/dL (ref 0.40–1.20)
Calcium: 9.6 mg/dL (ref 8.4–10.5)
GFR: 74.59 mL/min (ref >60.00)
GLUCOSE: 86 mg/dL (ref 70–99)
Potassium: 4.1 mEq/L (ref 3.5–5.1)
SODIUM: 140 meq/L (ref 135–145)
Total Bilirubin: 0.4 mg/dL (ref 0.2–1.2)
Total Protein: 7.3 g/dL (ref 6.0–8.3)

## 2015-02-17 LAB — CBC
HCT: 41.7 % (ref 36.0–46.0)
Hemoglobin: 14.2 g/dL (ref 12.0–15.0)
MCHC: 34.1 g/dL (ref 30.0–36.0)
MCV: 89.6 fl (ref 78.0–100.0)
PLATELETS: 331 10*3/uL (ref 150.0–400.0)
RBC: 4.66 Mil/uL (ref 3.87–5.11)
RDW: 13.4 % (ref 11.5–15.5)
WBC: 8.6 10*3/uL (ref 4.0–10.5)

## 2015-02-17 LAB — LIPID PANEL
CHOL/HDL RATIO: 7
CHOLESTEROL: 239 mg/dL — AB (ref 0–200)
HDL: 35.9 mg/dL — ABNORMAL LOW (ref >39.00)
Triglycerides: 440 mg/dL — ABNORMAL HIGH (ref 0.0–149.0)

## 2015-02-17 LAB — HEMOGLOBIN A1C: HEMOGLOBIN A1C: 6.3 % (ref 4.6–6.5)

## 2015-02-17 LAB — TSH: TSH: 0.92 u[IU]/mL (ref 0.35–4.50)

## 2015-02-17 LAB — LDL CHOLESTEROL, DIRECT: LDL DIRECT: 151 mg/dL

## 2015-02-18 ENCOUNTER — Other Ambulatory Visit: Payer: Self-pay

## 2015-02-18 DIAGNOSIS — E785 Hyperlipidemia, unspecified: Secondary | ICD-10-CM

## 2015-02-18 MED ORDER — ATORVASTATIN CALCIUM 10 MG PO TABS: 10.0000 mg | ORAL_TABLET | Freq: Every day | ORAL | Status: DC

## 2015-03-01 NOTE — Assessment & Plan Note (Signed)
Improving some with conservative measures. Elevate feet. Minimize sodium and compression hose prn

## 2015-03-01 NOTE — Assessment & Plan Note (Signed)
Agrees to starting Atorvastatin. Encouraged heart healthy diet, increase exercise, avoid trans fats, consider a krill oil cap daily

## 2015-03-01 NOTE — Progress Notes (Signed)
Subjective:    Patient ID: Phyllis Martinez, female    DOB: 1965/10/02, 49 y.o.   MRN: [id number]  Chief Complaint  Patient presents with  . Follow-up    tachycardia    HPI Patient is in today for follow-up. Overall she feels well. She is trying to stay more active and has begun biking and exercising more regularly. No recent illness. She is tired to decrease her carbohydrates and serving sizes. No recent illness or acute concerns noted. Denies CP/palp/SOB/HA/congestion/fevers/GI or GU c/o. Taking meds as prescribed  Past Medical History  Diagnosis Date  . Arthritis     RA  . Asthma   . Allergy   . Hypertension   . Other and unspecified hyperlipidemia 04/21/2013  . Allergic state 05/26/2013  . Pedal edema 05/26/2013  . ACP (advance care planning) 11/13/2014  . Mild intermittent asthma 11/16/2010    Past Surgical History  Procedure Laterality Date  . Cesarean section    . Adenoidectomy      Family History  Problem Relation Age of Onset  . Hyperlipidemia Mother   . Hypothyroidism Mother   . Arthritis Father     RA  . Cancer Father     prostate  . Arthritis Maternal Grandmother   . Heart disease Daughter     congenital/congenital    Social History   Social History  . Marital Status: Married    Spouse Name: N/A  . Number of Children: N/A  . Years of Education: N/A   Occupational History  . Not on file.   Social History Main Topics  . Smoking status: Former Games developer  . Smokeless tobacco: Never Used  . Alcohol Use: Yes  . Drug Use: Not on file  . Sexual Activity: Not on file     Comment: lives with husband, no dietary restriction   Other Topics Concern  . Not on file   Social History Narrative    Outpatient Prescriptions Prior to Visit  Medication Sig Dispense Refill  . albuterol (VENTOLIN HFA) 108 (90 BASE) MCG/ACT inhaler INHALE 2 PUFFS INTO THE LUNGS EVERY 6 HOURS AS NEEDED FOR WHEEZING 18 g 11  . fluticasone (FLOVENT HFA) 110 MCG/ACT inhaler Inhale 2  puffs into the lungs 2 (two) times daily as needed (sob/wheeze). Only fill if patient requests 1 Inhaler 6  . lisinopril (PRINIVIL,ZESTRIL) 10 MG tablet Take 1 tablet (10 mg total) by mouth 2 (two) times daily. 60 tablet 11   No facility-administered medications prior to visit.    Allergies  Allergen Reactions  . Advair Diskus [Fluticasone-Salmeterol]   . Chloraseptic Sore Throat [Acetaminophen]   . Hyzaar [Losartan Potassium-Hctz] Swelling  . Metoprolol     edema    Review of Systems  Constitutional: Negative for fever and malaise/fatigue.  HENT: Negative for congestion.   Eyes: Negative for discharge.  Respiratory: Negative for shortness of breath.   Cardiovascular: Positive for leg swelling. Negative for chest pain and palpitations.  Gastrointestinal: Negative for nausea and abdominal pain.  Genitourinary: Negative for dysuria.  Musculoskeletal: Negative for falls.  Skin: Negative for rash.  Neurological: Negative for loss of consciousness and headaches.  Endo/Heme/Allergies: Negative for environmental allergies.  Psychiatric/Behavioral: Negative for depression. The patient is not nervous/anxious.        Objective:    Physical Exam  Constitutional: She is oriented to person, place, and time. She appears well-developed and well-nourished. No distress.  HENT:  Head: Normocephalic and atraumatic.  Nose: Nose normal.  Eyes: Right eye  exhibits no discharge. Left eye exhibits no discharge.  Neck: Normal range of motion. Neck supple.  Cardiovascular: Normal rate and regular rhythm.   No murmur heard. Pulmonary/Chest: Effort normal and breath sounds normal.  Abdominal: Soft. Bowel sounds are normal. There is no tenderness.  Musculoskeletal: She exhibits no edema.  Neurological: She is alert and oriented to person, place, and time.  Skin: Skin is warm and dry.  Psychiatric: She has a normal mood and affect.  Nursing note and vitals reviewed.   BP 126/82 mmHg  Pulse 71   Temp(Src) 98 F (36.7 C) (Oral)  Resp 16  Ht  (1.651 m)  Wt 291 lb (131.997 kg)  BMI 48.43 kg/m2  SpO2 98% Wt Readings from Last 3 Encounters:  02/16/15 291 lb (131.997 kg)  11/13/14 306 lb 4 oz (138.914 kg)  08/27/13 301 lb 1.3 oz (136.569 kg)     Lab Results  Component Value Date   WBC 8.6 02/16/2015   HGB 14.2 02/16/2015   HCT 41.7 02/16/2015   PLT 331.0 02/16/2015   GLUCOSE 86 02/16/2015   CHOL 239* 02/16/2015   TRIG * 02/16/2015    440.0 Triglyceride is over 400; calculations on Lipids are invalid.   HDL 35.90* 02/16/2015   LDLDIRECT 151.0 02/16/2015   LDLCALC 140* 08/21/2013   ALT 20 02/16/2015   AST 20 02/16/2015   NA 140 02/16/2015   K 4.1 02/16/2015   CL 103 02/16/2015   CREATININE 0.86 02/16/2015   BUN 17 02/16/2015   CO2 30 02/16/2015   TSH 0.92 02/16/2015   HGBA1C 6.3 02/16/2015    Lab Results  Component Value Date   TSH 0.92 02/16/2015   Lab Results  Component Value Date   WBC 8.6 02/16/2015   HGB 14.2 02/16/2015   HCT 41.7 02/16/2015   MCV 89.6 02/16/2015   PLT 331.0 02/16/2015   Lab Results  Component Value Date   NA 140 02/16/2015   K 4.1 02/16/2015   CO2 30 02/16/2015   GLUCOSE 86 02/16/2015   BUN 17 02/16/2015   CREATININE 0.86 02/16/2015   BILITOT 0.4 02/16/2015   ALKPHOS 46 02/16/2015   AST 20 02/16/2015   ALT 20 02/16/2015   PROT 7.3 02/16/2015   ALBUMIN 4.1 02/16/2015   CALCIUM 9.6 02/16/2015   GFR 74.59 02/16/2015   Lab Results  Component Value Date   CHOL 239* 02/16/2015   Lab Results  Component Value Date   HDL 35.90* 02/16/2015   Lab Results  Component Value Date   LDLCALC 140* 08/21/2013   Lab Results  Component Value Date   TRIG * 02/16/2015    440.0 Triglyceride is over 400; calculations on Lipids are invalid.   Lab Results  Component Value Date   CHOLHDL 7 02/16/2015   Lab Results  Component Value Date   HGBA1C 6.3 02/16/2015       Assessment & Plan:   Problem List Items Addressed This  Visit    Pedal edema    Improving some with conservative measures. Elevate feet. Minimize sodium and compression hose prn      Obesity    Encouraged DASH diet, decrease po intake and increase exercise as tolerated. Needs 7-8 hours of sleep nightly. Avoid trans fats, eat small, frequent meals every 4-5 hours with lean proteins, complex carbs and healthy fats. Minimize simple carbs      Hyperlipidemia, mixed    Agrees to starting Atorvastatin. Encouraged heart healthy diet, increase exercise, avoid trans fats,  consider a krill oil cap daily      Relevant Orders   CBC (Completed)   TSH (Completed)   Comprehensive metabolic panel (Completed)   Lipid panel (Completed)   Hemoglobin A1c (Completed)   HTN (hypertension)    Well controlled, no changes to meds. Encouraged heart healthy diet such as the DASH diet and exercise as tolerated.       Relevant Orders   CBC (Completed)   TSH (Completed)   Comprehensive metabolic panel (Completed)   Lipid panel (Completed)   Hemoglobin A1c (Completed)    Other Visit Diagnoses    Hyperglycemia    -  Primary    Relevant Orders    CBC (Completed)    TSH (Completed)    Comprehensive metabolic panel (Completed)    Lipid panel (Completed)    Hemoglobin A1c (Completed)       I am having Ms. Perry maintain her fluticasone, lisinopril, and albuterol.  No orders of the defined types were placed in this encounter.     Danise Edge, MD

## 2015-03-01 NOTE — Assessment & Plan Note (Signed)
Encouraged DASH diet, decrease po intake and increase exercise as tolerated. Needs 7-8 hours of sleep nightly. Avoid trans fats, eat small, frequent meals every 4-5 hours with lean proteins, complex carbs and healthy fats. Minimize simple carbs 

## 2015-03-01 NOTE — Assessment & Plan Note (Signed)
Well controlled, no changes to meds. Encouraged heart healthy diet such as the DASH diet and exercise as tolerated.  °

## 2015-05-18 ENCOUNTER — Ambulatory Visit: Payer: Managed Care, Other (non HMO) | Admitting: Family Medicine

## 2015-05-20 IMAGING — US US SOFT TISSUE HEAD/NECK
1 series · 14 of 25 positions shown · non-contrast
Comparison: 02/13/2012

CLINICAL DATA: Goiter.  Thyroid nodules.

EXAM:
THYROID ULTRASOUND
TECHNIQUE: Ultrasound examination of the thyroid gland and adjacent soft
tissues was performed.

[Series 1: us soft tissue head/neck · 0.13mm/px · 14 of 51 slices shown]
[im 1/51]
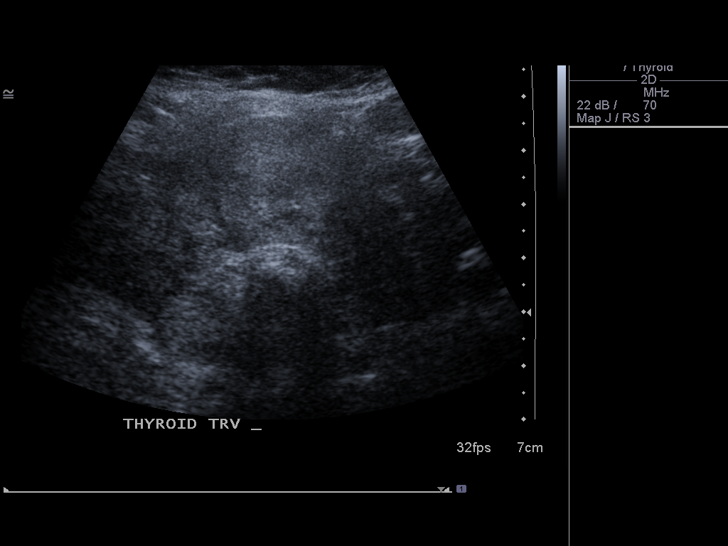
[im 5/51]
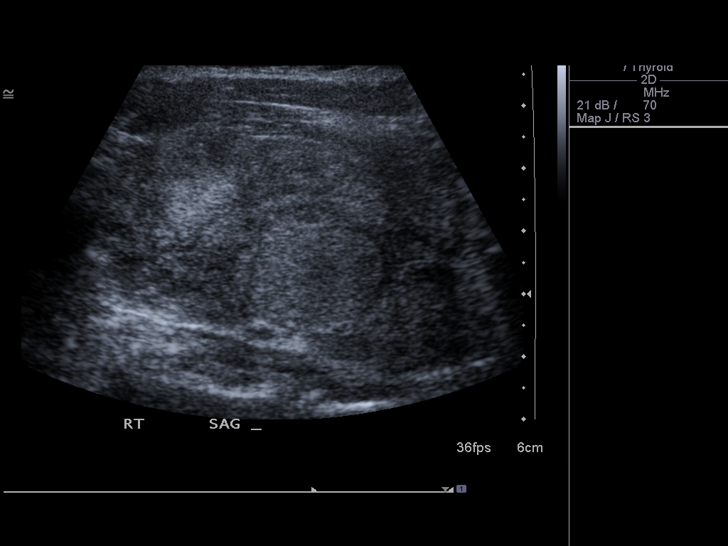
[im 9/51]
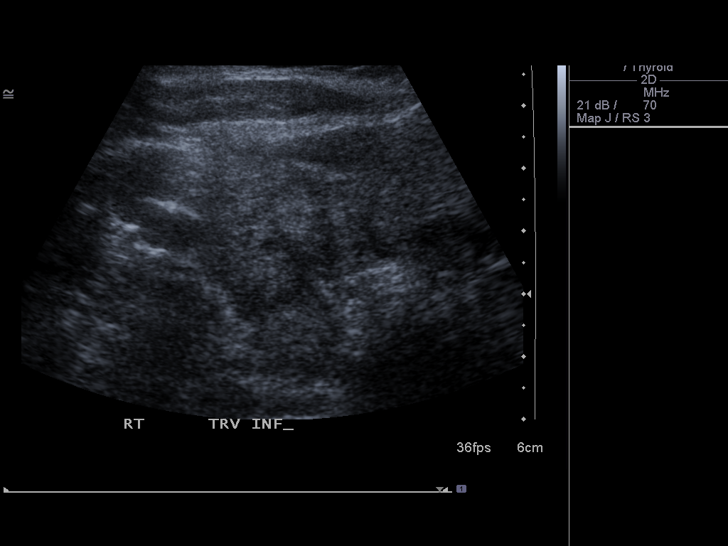
[im 13/51]
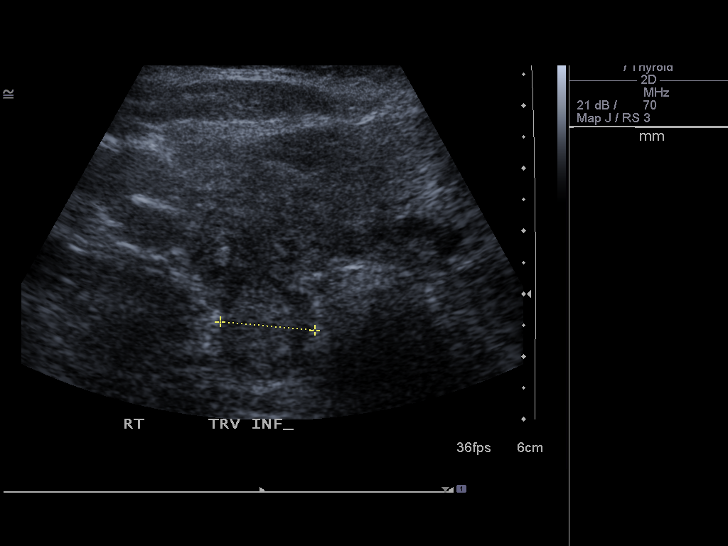
[im 17/51]
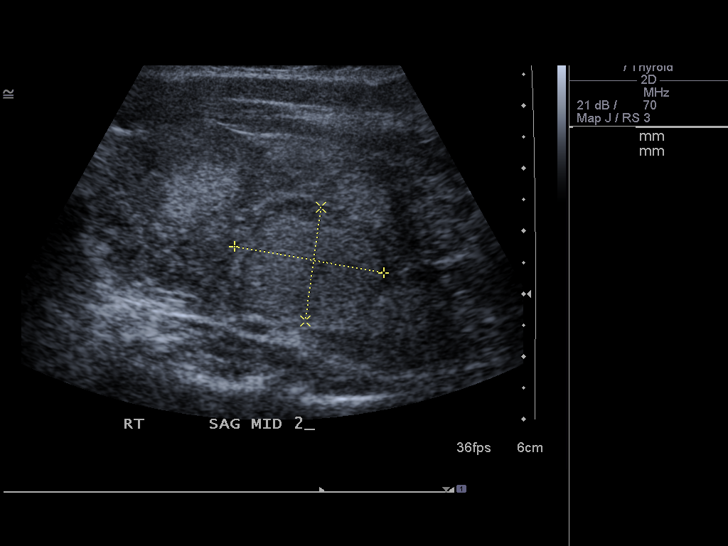
[im 19/51]
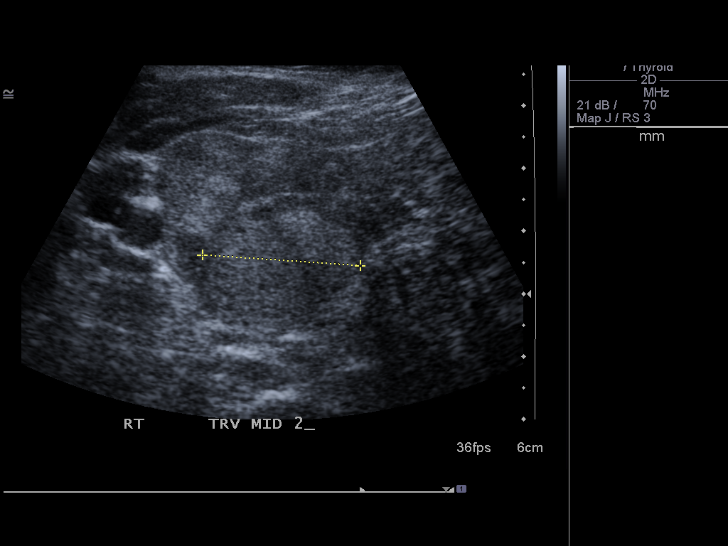
[im 23/51]
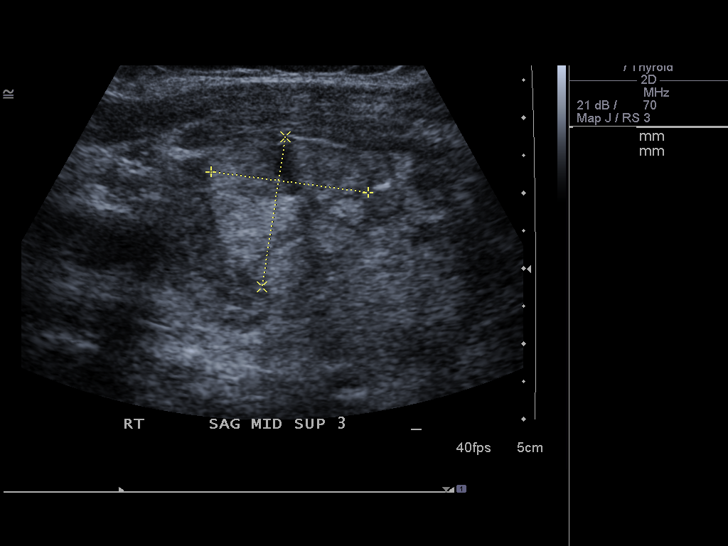
[im 28/51]
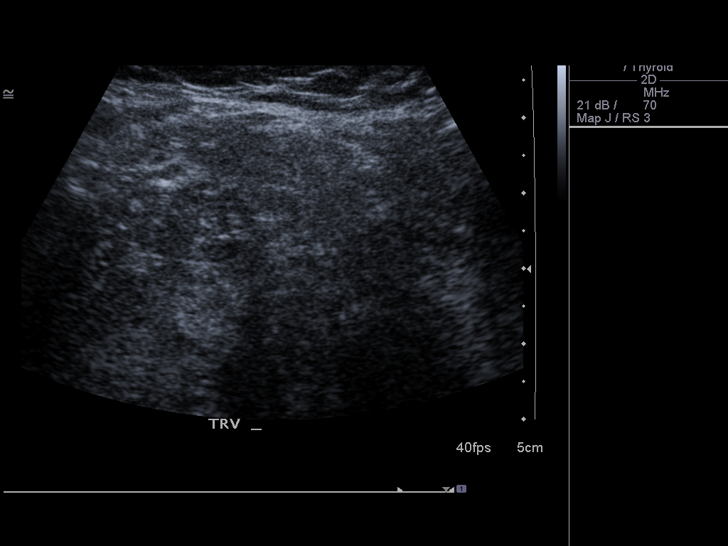
[im 32/51]
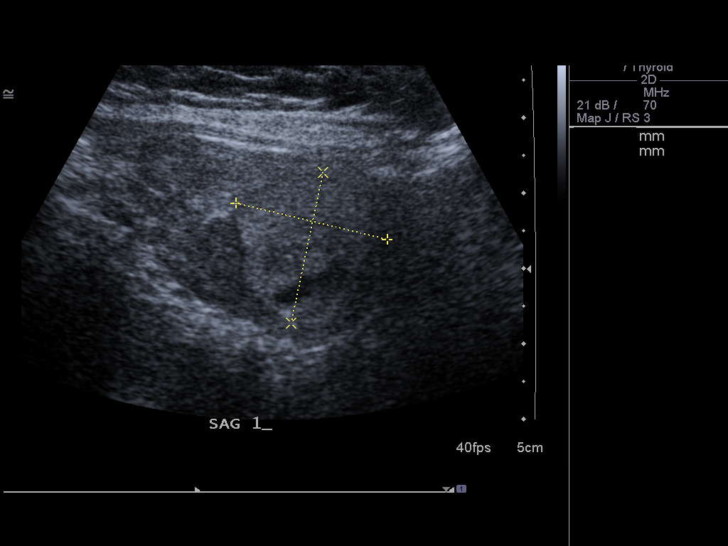
[im 34/51]
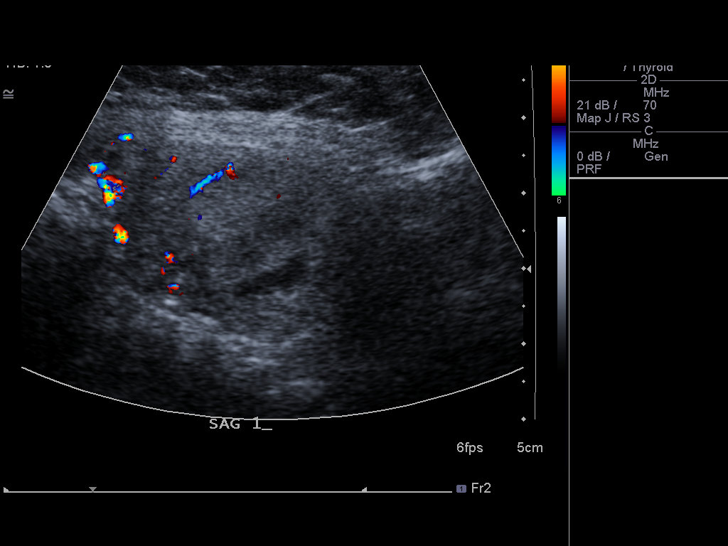
[im 38/51]
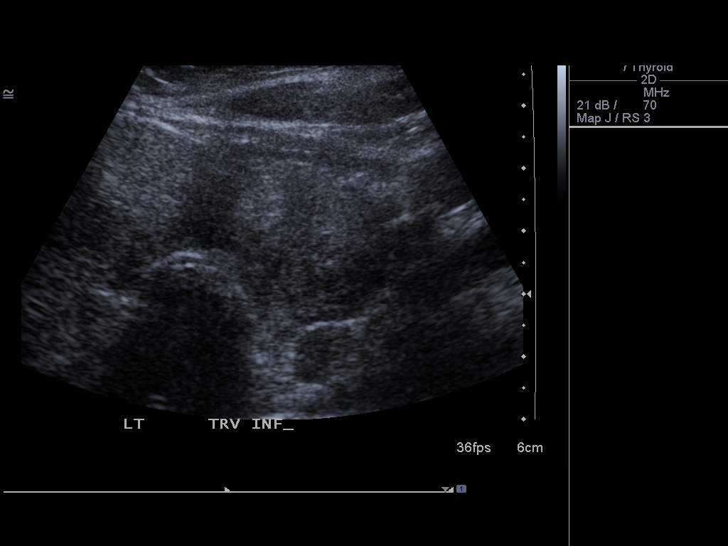
[im 42/51]
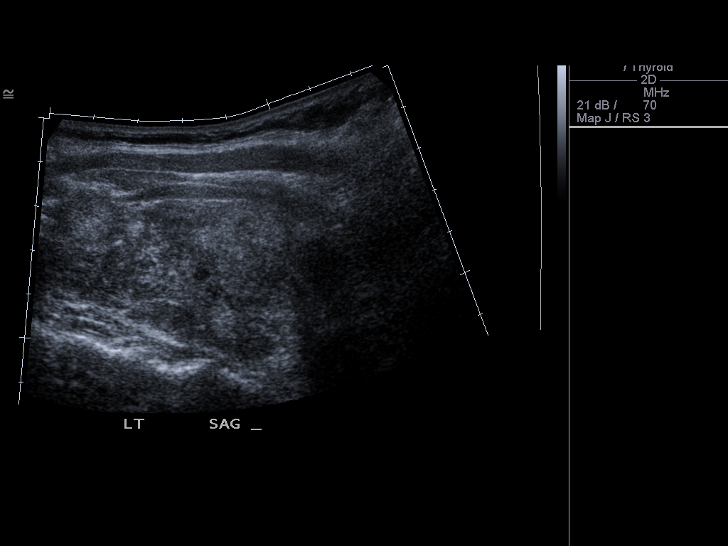
[im 46/51]
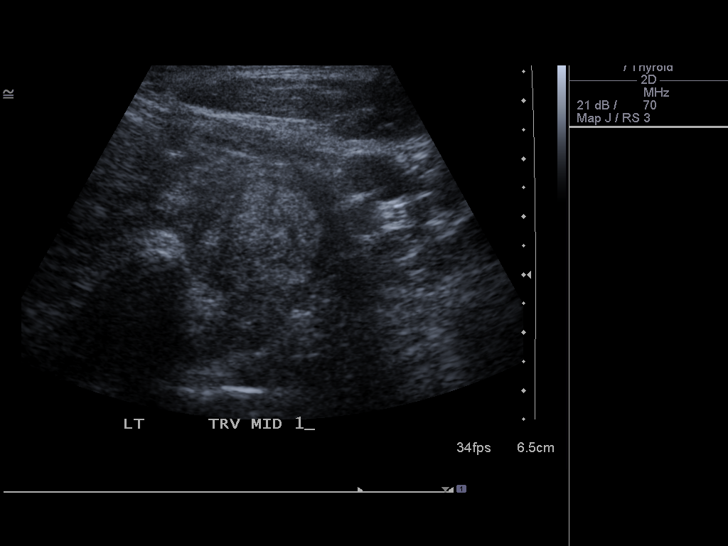
[im 51/51]
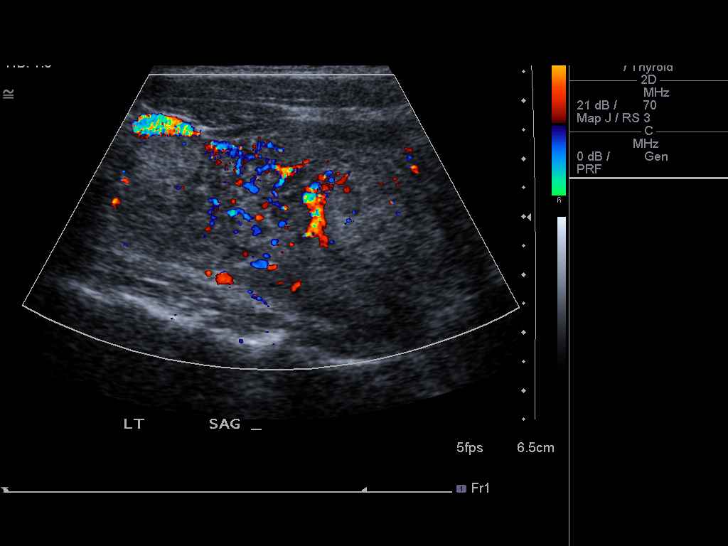

[14 of 25 positions shown; findings below may reference images not displayed]

FINDINGS: Right thyroid lobe

Measurements: 6.0 x 3.4 x 3.5 cm.. 1.5 x 1.7 x 1.8 cm solid nodule
in the right lower pole with a slightly hypoechoic margin,
unchanged. 2.4 x 2.5 x 1 point 8 cm solid slightly hyperechoic
nodule in the mid right lobe, unchanged, and a 2.1 x 1.8 x 2.0 cm
slightly hyperechoic solid nodule in the right upper pole, also
essentially unchanged.

Left thyroid lobe

Measurements: 6.7 x 3.7 x 2.7 cm. 2.0 x 1.7 x 1.9 cm solid
inhomogeneous nodule in the mid left lobe, minimally larger than on
the prior study. There is a 1.4 x 1.4 x 1.0 cm solid nodule in the
left upper pole which was not discretely identifiable on the prior
study but I suspect it was present based on the contour.

Isthmus

Thickness: 2.2 cm. 2.1 x 2.4 x 2.0 cm solid slightly complex nodule,
essentially unchanged in size. The small cystic component is new.

Lymphadenopathy

None visualized.
IMPRESSION: Multinodular goiter without significant change.

## 2015-07-13 NOTE — Telephone Encounter (Signed)
See msg re: flu shot

## 2015-11-12 ENCOUNTER — Other Ambulatory Visit: Payer: Self-pay | Admitting: Family Medicine

## 2015-11-12 MED ORDER — LISINOPRIL 10 MG PO TABS: 10.0000 mg | ORAL_TABLET | Freq: Two times a day (BID) | ORAL | Status: DC

## 2015-12-21 ENCOUNTER — Ambulatory Visit: Payer: Managed Care, Other (non HMO) | Admitting: Family Medicine

## 2015-12-24 ENCOUNTER — Ambulatory Visit (INDEPENDENT_AMBULATORY_CARE_PROVIDER_SITE_OTHER): Payer: Managed Care, Other (non HMO) | Admitting: Family Medicine

## 2015-12-24 ENCOUNTER — Encounter: Payer: Self-pay | Admitting: Family Medicine

## 2015-12-24 VITALS — BP 132/92 | HR 85 | Temp 98.3°F | Ht 65.0 in | Wt 303.2 lb

## 2015-12-24 DIAGNOSIS — L600 Ingrowing nail: Secondary | ICD-10-CM | POA: Diagnosis not present

## 2015-12-24 DIAGNOSIS — E119 Type 2 diabetes mellitus without complications: Secondary | ICD-10-CM | POA: Insufficient documentation

## 2015-12-24 DIAGNOSIS — E669 Obesity, unspecified: Secondary | ICD-10-CM

## 2015-12-24 DIAGNOSIS — J4520 Mild intermittent asthma, uncomplicated: Secondary | ICD-10-CM

## 2015-12-24 DIAGNOSIS — E782 Mixed hyperlipidemia: Secondary | ICD-10-CM

## 2015-12-24 DIAGNOSIS — B351 Tinea unguium: Secondary | ICD-10-CM

## 2015-12-24 DIAGNOSIS — I10 Essential (primary) hypertension: Secondary | ICD-10-CM

## 2015-12-24 DIAGNOSIS — R739 Hyperglycemia, unspecified: Secondary | ICD-10-CM

## 2015-12-24 DIAGNOSIS — Z0000 Encounter for general adult medical examination without abnormal findings: Secondary | ICD-10-CM

## 2015-12-24 HISTORY — DX: Type 2 diabetes mellitus without complications: E11.9

## 2015-12-24 LAB — CBC
HCT: 40.6 % (ref 36.0–46.0)
HEMOGLOBIN: 14 g/dL (ref 12.0–15.0)
MCHC: 34.5 g/dL (ref 30.0–36.0)
MCV: 87.4 fl (ref 78.0–100.0)
Platelets: 298 10*3/uL (ref 150.0–400.0)
RBC: 4.64 Mil/uL (ref 3.87–5.11)
RDW: 13.2 % (ref 11.5–15.5)
WBC: 9.3 10*3/uL (ref 4.0–10.5)

## 2015-12-24 LAB — LDL CHOLESTEROL, DIRECT: LDL DIRECT: 132 mg/dL

## 2015-12-24 LAB — HEMOGLOBIN A1C: Hgb A1c MFr Bld: 7.7 % — ABNORMAL HIGH (ref 4.6–6.5)

## 2015-12-24 LAB — COMPREHENSIVE METABOLIC PANEL
ALK PHOS: 49 U/L (ref 39–117)
ALT: 17 U/L (ref 0–35)
AST: 16 U/L (ref 0–37)
Albumin: 4 g/dL (ref 3.5–5.2)
BILIRUBIN TOTAL: 0.6 mg/dL (ref 0.2–1.2)
BUN: 10 mg/dL (ref 6–23)
CO2: 25 meq/L (ref 19–32)
Calcium: 9.3 mg/dL (ref 8.4–10.5)
Chloride: 102 mEq/L (ref 96–112)
Creatinine, Ser: 0.74 mg/dL (ref 0.40–1.20)
GFR: 88.41 mL/min (ref >60.00)
GLUCOSE: 194 mg/dL — AB (ref 70–99)
Potassium: 3.9 mEq/L (ref 3.5–5.1)
SODIUM: 136 meq/L (ref 135–145)
TOTAL PROTEIN: 7.3 g/dL (ref 6.0–8.3)

## 2015-12-24 LAB — LIPID PANEL
Cholesterol: 211 mg/dL — ABNORMAL HIGH (ref 0–200)
HDL: 33.9 mg/dL — AB (ref >39.00)
NONHDL: 177.33
Total CHOL/HDL Ratio: 6
Triglycerides: 311 mg/dL — ABNORMAL HIGH (ref 0.0–149.0)
VLDL: 62.2 mg/dL — ABNORMAL HIGH (ref 0.0–40.0)

## 2015-12-24 LAB — TSH: TSH: 1.79 u[IU]/mL (ref 0.35–4.50)

## 2015-12-24 MED ORDER — FLUTICASONE PROPIONATE HFA 110 MCG/ACT IN AERO: 2.0000 | INHALATION_SPRAY | Freq: Two times a day (BID) | RESPIRATORY_TRACT | Status: DC | PRN

## 2015-12-24 MED ORDER — ALBUTEROL SULFATE HFA 108 (90 BASE) MCG/ACT IN AERS: INHALATION_SPRAY | RESPIRATORY_TRACT | Status: DC

## 2015-12-24 NOTE — Assessment & Plan Note (Signed)
Has struggled with first 2 toes of both feet. Soak in distilled vinegar and hot water nightly and apply Lamisil cream. Referred to podiatry

## 2015-12-24 NOTE — Patient Instructions (Signed)
Nail Ringworm A fungal infection of the nail (tinea unguium/onychomycosis) is common. It is common as the visible part of the nail is composed of dead cells which have no blood supply to help prevent infection. It occurs because fungi are everywhere and will pick any opportunity to grow on any dead material. Because nails are very slow growing they require up to 2 years of treatment with anti-fungal medications. The entire nail back to the base is infected. This includes approximately  of the nail which you cannot see. If your caregiver has prescribed a medication by mouth, take it every day and as directed. No progress will be seen for at least 6 to 9 months. Do not be disappointed! Because fungi live on dead cells with little or no exposure to blood supply, medication delivery to the infection is slow; thus the cure is slow. It is also why you can observe no progress in the first 6 months. The nail becoming cured is the base of the nail, as it has the blood supply. Topical medication such as creams and ointments are usually not effective. Important in successful treatment of nail fungus is closely following the medication regimen that your doctor prescribes. Sometimes you and your caregiver may elect to speed up this process by surgical removal of all the nails. Even this may still require 6 to 9 months of additional oral medications. See your caregiver as directed. Remember there will be no visible improvement for at least 6 months. See your caregiver sooner if other signs of infection (redness and swelling) develop.   This information is not intended to replace advice given to you by your health care provider. Make sure you discuss any questions you have with your health care provider.   Document Released: 05/20/2000 Document Revised: 10/07/2014 Document Reviewed: 11/24/2014 Elsevier Interactive Patient Education 2016 [employer]  

## 2015-12-24 NOTE — Assessment & Plan Note (Signed)
hgba1c acceptable, minimize simple carbs. Increase exercise as toleratedurrent meds

## 2015-12-24 NOTE — Assessment & Plan Note (Signed)
No recent flares, reports no albuterol use in over a year

## 2015-12-24 NOTE — Assessment & Plan Note (Signed)
Encouraged heart healthy diet, increase exercise, avoid trans fats, consider a krill oil cap daily 

## 2015-12-24 NOTE — Progress Notes (Signed)
Patient ID: Phyllis Martinez, female   DOB: 1965/06/28, 50 y.o.   MRN: [id number]   Subjective:    Patient ID: Phyllis Martinez, female    DOB: 10-10-1965, 50 y.o.   MRN: [id number]  Chief Complaint  Patient presents with  . Follow-up    HPI Patient is in today for follow up. She continues to struggle with thickened toenails and foot pain. Is interested in a referral to podiatry. No recent acute illness or other acute concerns. Denies CP/palp/SOB/HA/congestion/fevers/GI or GU c/o. Taking meds as prescribed. No recent flares in asthma.     Past Medical History:  Diagnosis Date  . ACP (advance care planning) 11/13/2014  . Allergic state 05/26/2013  . Allergy   . Arthritis    RA  . Asthma   . Hypertension   . Mild intermittent asthma 11/16/2010  . Other and unspecified hyperlipidemia 04/21/2013  . Pedal edema 05/26/2013    Past Surgical History:  Procedure Laterality Date  . ADENOIDECTOMY    . CESAREAN SECTION      Family History  Problem Relation Age of Onset  . Hyperlipidemia Mother   . Hypothyroidism Mother   . Arthritis Father     RA  . Cancer Father     prostate  . Arthritis Maternal Grandmother   . Heart disease Daughter     congenital/congenital    Social History   Social History  . Marital status: Married    Spouse name: N/A  . Number of children: N/A  . Years of education: N/A   Occupational History  . Not on file.   Social History Main Topics  . Smoking status: Former Research scientist (life sciences)  . Smokeless tobacco: Never Used  . Alcohol use Yes  . Drug use: Unknown  . Sexual activity: Not on file     Comment: lives with husband, no dietary restriction   Other Topics Concern  . Not on file   Social History Narrative  . No narrative on file    Outpatient Medications Prior to Visit  Medication Sig Dispense Refill  . lisinopril (PRINIVIL,ZESTRIL) 10 MG tablet Take 1 tablet (10 mg total) by mouth 2 (two) times daily. 60 tablet 4  . albuterol (VENTOLIN HFA) 108 (90 BASE)  MCG/ACT inhaler INHALE 2 PUFFS INTO THE LUNGS EVERY 6 HOURS AS NEEDED FOR WHEEZING 18 g 11  . atorvastatin (LIPITOR) 10 MG tablet Take 1 tablet (10 mg total) by mouth at bedtime. 30 tablet 5  . fluticasone (FLOVENT HFA) 110 MCG/ACT inhaler Inhale 2 puffs into the lungs 2 (two) times daily as needed (sob/wheeze). Only fill if patient requests 1 Inhaler 6   No facility-administered medications prior to visit.     Allergies  Allergen Reactions  . Advair Diskus [Fluticasone-Salmeterol]   . Chloraseptic Sore Throat [Acetaminophen]   . Hyzaar [Losartan Potassium-Hctz] Swelling  . Metoprolol     edema    Review of Systems  Constitutional: Negative for fever and malaise/fatigue.  HENT: Negative for congestion.   Eyes: Negative for blurred vision.  Respiratory: Negative for shortness of breath.   Cardiovascular: Negative for chest pain, palpitations and leg swelling.  Gastrointestinal: Negative for nausea, abdominal pain and blood in stool.  Genitourinary: Negative for dysuria and frequency.  Musculoskeletal: Negative for falls.  Skin: Negative for rash.  Neurological: Negative for dizziness, loss of consciousness and headaches.  Endo/Heme/Allergies: Negative for environmental allergies.  Psychiatric/Behavioral: Negative for depression. The patient is not nervous/anxious.        Objective:  Physical Exam  Constitutional: She is oriented to person, place, and time. She appears well-developed and well-nourished. No distress.  HENT:  Head: Normocephalic and atraumatic.  Nose: Nose normal.  Eyes: Right eye exhibits no discharge. Left eye exhibits no discharge.  Neck: Normal range of motion. Neck supple.  Cardiovascular: Normal rate and regular rhythm.   No murmur heard. Pulmonary/Chest: Effort normal and breath sounds normal.  Abdominal: Soft. Bowel sounds are normal. There is no tenderness.  Musculoskeletal: She exhibits no edema.  Neurological: She is alert and oriented to  person, place, and time.  Skin: Skin is warm and dry.  Psychiatric: She has a normal mood and affect.  Nursing note and vitals reviewed.   BP (!) 132/92   Pulse 85   Temp 98.3 F (36.8 C) (Oral)   Ht 5' 5" (1.651 m)   Wt (!) 303 lb 4 oz (137.6 kg)   SpO2 97%   BMI 50.46 kg/m   Wt Readings from Last 3 Encounters:  12/24/15 (!) 303 lb 4 oz (137.6 kg)  02/16/15 291 lb (132 kg)  11/13/14 (!) 306 lb 4 oz (138.9 kg)     Lab Results  Component Value Date   WBC 9.3 12/24/2015   HGB 14.0 12/24/2015   HCT 40.6 12/24/2015   PLT 298.0 12/24/2015   GLUCOSE 194 (H) 12/24/2015   CHOL 211 (H) 12/24/2015   TRIG 311.0 (H) 12/24/2015   HDL 33.90 (L) 12/24/2015   LDLDIRECT 132.0 12/24/2015   LDLCALC 140 (H) 08/21/2013   ALT 17 12/24/2015   AST 16 12/24/2015   NA 136 12/24/2015   K 3.9 12/24/2015   CL 102 12/24/2015   CREATININE 0.74 12/24/2015   BUN 10 12/24/2015   CO2 25 12/24/2015   TSH 1.79 12/24/2015   HGBA1C 7.7 (H) 12/24/2015    Lab Results  Component Value Date   TSH 1.79 12/24/2015   Lab Results  Component Value Date   WBC 9.3 12/24/2015   HGB 14.0 12/24/2015   HCT 40.6 12/24/2015   MCV 87.4 12/24/2015   PLT 298.0 12/24/2015   Lab Results  Component Value Date   NA 136 12/24/2015   K 3.9 12/24/2015   CO2 25 12/24/2015   GLUCOSE 194 (H) 12/24/2015   BUN 10 12/24/2015   CREATININE 0.74 12/24/2015   BILITOT 0.6 12/24/2015   ALKPHOS 49 12/24/2015   AST 16 12/24/2015   ALT 17 12/24/2015   PROT 7.3 12/24/2015   ALBUMIN 4.0 12/24/2015   CALCIUM 9.3 12/24/2015   GFR 88.41 12/24/2015   Lab Results  Component Value Date   CHOL 211 (H) 12/24/2015   Lab Results  Component Value Date   HDL 33.90 (L) 12/24/2015   Lab Results  Component Value Date   LDLCALC 140 (H) 08/21/2013   Lab Results  Component Value Date   TRIG 311.0 (H) 12/24/2015   Lab Results  Component Value Date   CHOLHDL 6 12/24/2015   Lab Results  Component Value Date   HGBA1C 7.7  (H) 12/24/2015       Assessment & Plan:   Problem List Items Addressed This Visit    Mild intermittent asthma    No recent flares, reports no albuterol use in over a year      Relevant Medications   fluticasone (FLOVENT HFA) 110 MCG/ACT inhaler   albuterol (VENTOLIN HFA) 108 (90 Base) MCG/ACT inhaler   HTN (hypertension)    Mildly elevated, no changes for now asymptomatic, minimize sodium  and attempt modest weight loss      Relevant Orders   Comp Met (CMET) (Completed)   Lipid panel (Completed)   CBC (Completed)   TSH (Completed)   Hemoglobin A1c (Completed)   Obesity   Relevant Orders   Comp Met (CMET) (Completed)   Lipid panel (Completed)   CBC (Completed)   TSH (Completed)   Hemoglobin A1c (Completed)   Annual physical exam   Relevant Orders   Comp Met (CMET) (Completed)   Lipid panel (Completed)   CBC (Completed)   TSH (Completed)   Hemoglobin A1c (Completed)   Hyperlipidemia, mixed    Encouraged heart healthy diet, increase exercise, avoid trans fats, consider a krill oil cap daily      Relevant Orders   Comp Met (CMET) (Completed)   Lipid panel (Completed)   CBC (Completed)   TSH (Completed)   Hemoglobin A1c (Completed)   Onychomycosis - Primary   Relevant Orders   Ambulatory referral to Podiatry   Comp Met (CMET) (Completed)   Lipid panel (Completed)   CBC (Completed)   TSH (Completed)   Hemoglobin A1c (Completed)   Ingrown nail    Has struggled with first 2 toes of both feet. Soak in distilled vinegar and hot water nightly and apply Lamisil cream. Referred to podiatry      Relevant Orders   Ambulatory referral to Podiatry   Comp Met (CMET) (Completed)   Lipid panel (Completed)   CBC (Completed)   TSH (Completed)   Hemoglobin A1c (Completed)   Hyperglycemia    hgba1c acceptable, minimize simple carbs. Increase exercise as toleratedurrent meds      Relevant Orders   Hemoglobin A1c (Completed)    Other Visit Diagnoses   None.     I  have discontinued Ms. Fehrman's atorvastatin. I am also having her maintain her lisinopril, fluticasone, and albuterol.  Meds ordered this encounter  Medications  . fluticasone (FLOVENT HFA) 110 MCG/ACT inhaler    Sig: Inhale 2 puffs into the lungs 2 (two) times daily as needed (sob/wheeze). Only fill if patient requests    Dispense:  1 Inhaler    Refill:  6  . albuterol (VENTOLIN HFA) 108 (90 Base) MCG/ACT inhaler    Sig: INHALE 2 PUFFS INTO THE LUNGS EVERY 6 HOURS AS NEEDED FOR WHEEZING    Dispense:  18 g    Refill:  11     Penni Homans, MD

## 2015-12-24 NOTE — Assessment & Plan Note (Signed)
Mildly elevated, no changes for now asymptomatic, minimize sodium and attempt modest weight loss

## 2015-12-24 NOTE — Progress Notes (Signed)
Pre visit review using our clinic review tool, if applicable. No additional management support is needed unless otherwise documented below in the visit note. 

## 2015-12-25 ENCOUNTER — Other Ambulatory Visit: Payer: Self-pay | Admitting: Family Medicine

## 2015-12-25 MED ORDER — ONETOUCH LANCETS MISC: Status: DC

## 2015-12-25 MED ORDER — METFORMIN HCL 500 MG PO TABS: 500.0000 mg | ORAL_TABLET | Freq: Every day | ORAL | Status: DC

## 2015-12-25 MED ORDER — GLUCOSE BLOOD VI STRP: ORAL_STRIP | Status: DC

## 2015-12-25 MED ORDER — ONETOUCH BASIC SYSTEM W/DEVICE KIT: PACK | Status: DC

## 2016-01-13 ENCOUNTER — Ambulatory Visit (INDEPENDENT_AMBULATORY_CARE_PROVIDER_SITE_OTHER): Payer: Managed Care, Other (non HMO) | Admitting: Podiatry

## 2016-01-13 ENCOUNTER — Encounter: Payer: Self-pay | Admitting: Podiatry

## 2016-01-13 DIAGNOSIS — L600 Ingrowing nail: Secondary | ICD-10-CM

## 2016-01-13 NOTE — Progress Notes (Signed)
   Subjective:    Patient ID: Phyllis LeiLisa Gladson, female    DOB: Oct 03, 1965, 50 y.o.   MRN: [id number]  HPI  50 year old female presents the also concerns of ingrown toenails to both of her big toes and second toes. She says the right side is worse than the left. She states occasionally gets red and swollen around the nail borders. He tries trim them herself however continues to reoccur. She's been putting Neosporin on an toenails which is been helping. Denies any drainage or pus. No other complaints today.   Review of Systems  All other systems reviewed and are negative.      Objective:   Physical Exam General: AAO x3, NAD  Dermatological: There is evidence incurvation of both the medial and lateral nail borders of bilateral hallux and second digit toenails of the right side worse than left. There is localized edema and erythema localized on the right side hallux and second digit toenails. There is tenderness to the ingrown toenail. There is no drainage or pus. No   open lesions or pre-ulcer lesions identified.  Vascular: Dorsalis Pedis artery and Posterior Tibial artery pedal pulses are 2/4 bilateral with immedate capillary fill time. Pedal hair growth present. There is no pain with calf compression, swelling, warmth, erythema.   Neruologic: Grossly intact via light touch bilateral. Vibratory intact via tuning fork bilateral. Protective threshold with Semmes Wienstein monofilament intact to all pedal sites bilateral.   Musculoskeletal: No gross boney pedal deformities bilateral. No pain, crepitus, or limitation noted with foot and ankle range of motion bilateral. Muscular strength 5/5 in all groups tested bilateral.  Gait: Unassisted, Nonantalgic.      Assessment & Plan:  50 year old female bilateral hallux and second digit symptomatic ingrown toenails of the right side worse in the left. -Treatment options discussed including all alternatives, risks, and complications -Etiology of symptoms  were discussed -At this time, the patient is requesting partial nail removal with chemical matricectomy to the symptomatic portion of the nail. She will do to go ahead and proceed with a right size and to the left side next appointment. Risks and complications were discussed with the patient for which they understand and  verbally consent to the procedure. Under sterile conditions a total of 3 mL of a mixture of 2% lidocaine plain and 0.5% Marcaine plain was infiltrated in a hallux block fashion. Once anesthetized, the skin was prepped in sterile fashion. A tourniquet was then applied. Next the medial and lateral aspect of hallux nail border was then sharply excised making sure to remove the entire offending nail border. Once the nails were ensured to be removed area was debrided and the underlying skin was intact. There is no purulence identified in the procedure. Next phenol was then applied under standard conditions and copiously irrigated. Silvadene was applied. A dry sterile dressing was applied. After application of the dressing the tourniquet was removed and there is found to be an immediate capillary refill time to the digit. The patient tolerated the procedure well any complications. Post procedure instructions were discussed the patient for which he verbally understood. Follow-up in one week for nail check or sooner if any problems are to arise. Discussed signs/symptoms of infection and directed to call the office immediately should any occur or go directly to the emergency room. In the meantime, encouraged to call the office with any questions, concerns, changes symptoms.

## 2016-01-13 NOTE — Patient Instructions (Signed)

## 2016-01-14 MED ORDER — CEPHALEXIN 500 MG PO CAPS: 500.0000 mg | ORAL_CAPSULE | Freq: Three times a day (TID) | ORAL | 2 refills | Status: DC

## 2016-02-03 ENCOUNTER — Ambulatory Visit: Payer: Managed Care, Other (non HMO) | Admitting: Podiatry

## 2016-02-16 ENCOUNTER — Ambulatory Visit (INDEPENDENT_AMBULATORY_CARE_PROVIDER_SITE_OTHER): Payer: Managed Care, Other (non HMO) | Admitting: Podiatry

## 2016-02-16 DIAGNOSIS — L600 Ingrowing nail: Secondary | ICD-10-CM | POA: Diagnosis not present

## 2016-02-16 DIAGNOSIS — B351 Tinea unguium: Secondary | ICD-10-CM

## 2016-02-16 NOTE — Progress Notes (Signed)
Subjective: 50 year old female presents the office today for follow-up evaluation of right hallux and second toe status post partial nail avulsion she states they're doing well. She's been soaking in Epson salt soaks, Neosporin and a Band-Aid. She denies any pain or drainage or any swelling. She states her left big toe and second toe continue the ingrowing she is requesting to go ahead and proceed with partial nail avulsion the left side as the right side is done well. She states these nails become ingrown and painful as his been ongoing for quite some time. Denies any systemic complaints such as fevers, chills, nausea, vomiting. No acute changes since last appointment, and no other complaints at this time.   Objective: AAO x3, NAD DP/PT pulses palpable bilaterally, CRT less than 3 seconds Status post medial and lateral nail border partial nail avulsions to the right hallux toenail is a scab formed. There is no tenderness there is no surrounding erythema, ascending synovitis, fluctuance, crepitus, drainage. No signs of infection. The areas are healing well. This continuation of ingrown toenails the left hallux and second digit toenail both the medial lateral nail borders with tenderness palpation. There is no erythema how there is no fluctuance edema on the nail borders. There is no ascending synovitis. There is no fluctuance or crepitus there is no malodor. No open lesions or pre-ulcerative lesions.  No pain with calf compression, swelling, warmth, erythema  Assessment: Ingrown toenail left hallux and second digit medial lateral nail borders with healing procedure site on the right foot  Plan: -All treatment options discussed with the patient including all alternatives, risks, complications.  -On the right foot continue with antibiotic ointment and a bandage until the scabs result. Monitor for any signs or symptoms of infection to call the office. -At this time, the patient is requesting partial  nail removal with chemical matricectomy to the symptomatic portion of the nail on the left foot today. Risks and complications were discussed with the patient for which they understand and  verbally consent to the procedure. Under sterile conditions a total of 3 mL of a mixture of 2% lidocaine plain and 0.5% Marcaine plain was infiltrated in a hallux block fashion. Once anesthetized, the skin was prepped in sterile fashion. A tourniquet was then applied. Next the medial and lateral aspect of hallux nail border was then sharply excised making sure to remove the entire offending nail border. Once the nails were ensured to be removed area was debrided and the underlying skin was intact. There is no purulence identified in the procedure. Next phenol was then applied under standard conditions and copiously irrigated. Silvadene was applied. A dry sterile dressing was applied. After application of the dressing the tourniquet was removed and there is found to be an immediate capillary refill time to the digit. The patient tolerated the procedure well any complications. Post procedure instructions were discussed the patient for which he verbally understood. Follow-up in 2 weeks for nail check or sooner if any problems are to arise. Discussed signs/symptoms of infection and directed to call the office immediately should any occur or go directly to the emergency room. In the meantime, encouraged to call the office with any questions, concerns, changes symptoms.  Ovid CurdMatthew Alivia Cimino, DPM

## 2016-02-16 NOTE — Patient Instructions (Signed)

## 2016-02-18 NOTE — Addendum Note (Signed)
Addended by: Hadley PenOX, Oleta R on: 02/18/2016 07:50 AM   Modules accepted: Orders

## 2016-03-01 ENCOUNTER — Ambulatory Visit: Payer: Managed Care, Other (non HMO) | Admitting: Podiatry

## 2016-03-07 ENCOUNTER — Telehealth: Payer: Self-pay | Admitting: *Deleted

## 2016-03-07 NOTE — Telephone Encounter (Addendum)
Dr. Ardelle AntonWagoner reviewed 02/16/2016 fungal culture results as negative,may offer [id number]. Left message to call for results. 03/08/2016-left message with DR. Wagoner's recommendation.

## 2016-03-30 ENCOUNTER — Other Ambulatory Visit: Payer: Self-pay | Admitting: Family Medicine

## 2016-04-14 ENCOUNTER — Ambulatory Visit: Payer: Managed Care, Other (non HMO) | Admitting: Family Medicine

## 2016-11-07 ENCOUNTER — Other Ambulatory Visit: Payer: Self-pay | Admitting: Family Medicine

## 2016-11-17 ENCOUNTER — Ambulatory Visit (INDEPENDENT_AMBULATORY_CARE_PROVIDER_SITE_OTHER): Payer: Managed Care, Other (non HMO) | Admitting: Family Medicine

## 2016-11-17 ENCOUNTER — Encounter: Payer: Self-pay | Admitting: Family Medicine

## 2016-11-17 VITALS — BP 114/82 | HR 54 | Temp 98.1°F | Resp 10 | Ht 65.0 in | Wt 209.6 lb

## 2016-11-17 DIAGNOSIS — E782 Mixed hyperlipidemia: Secondary | ICD-10-CM | POA: Diagnosis not present

## 2016-11-17 DIAGNOSIS — I10 Essential (primary) hypertension: Secondary | ICD-10-CM | POA: Diagnosis not present

## 2016-11-17 DIAGNOSIS — R739 Hyperglycemia, unspecified: Secondary | ICD-10-CM | POA: Diagnosis not present

## 2016-11-17 DIAGNOSIS — Z1231 Encounter for screening mammogram for malignant neoplasm of breast: Secondary | ICD-10-CM

## 2016-11-17 DIAGNOSIS — Z0000 Encounter for general adult medical examination without abnormal findings: Secondary | ICD-10-CM | POA: Diagnosis not present

## 2016-11-17 DIAGNOSIS — Z124 Encounter for screening for malignant neoplasm of cervix: Secondary | ICD-10-CM | POA: Diagnosis not present

## 2016-11-17 DIAGNOSIS — Z1239 Encounter for other screening for malignant neoplasm of breast: Secondary | ICD-10-CM

## 2016-11-17 DIAGNOSIS — E669 Obesity, unspecified: Secondary | ICD-10-CM

## 2016-11-17 LAB — CBC
HEMATOCRIT: 43.4 % (ref 36.0–46.0)
HEMOGLOBIN: 14.7 g/dL (ref 12.0–15.0)
MCHC: 33.8 g/dL (ref 30.0–36.0)
MCV: 89.3 fl (ref 78.0–100.0)
PLATELETS: 310 10*3/uL (ref 150.0–400.0)
RBC: 4.86 Mil/uL (ref 3.87–5.11)
RDW: 13.2 % (ref 11.5–15.5)
WBC: 5.9 10*3/uL (ref 4.0–10.5)

## 2016-11-17 LAB — TSH: TSH: 0.76 u[IU]/mL (ref 0.35–4.50)

## 2016-11-17 LAB — COMPREHENSIVE METABOLIC PANEL
ALT: 15 U/L (ref 0–35)
AST: 17 U/L (ref 0–37)
Albumin: 4.2 g/dL (ref 3.5–5.2)
Alkaline Phosphatase: 42 U/L (ref 39–117)
BILIRUBIN TOTAL: 0.7 mg/dL (ref 0.2–1.2)
BUN: 16 mg/dL (ref 6–23)
CALCIUM: 10.1 mg/dL (ref 8.4–10.5)
CO2: 30 meq/L (ref 19–32)
Chloride: 104 mEq/L (ref 96–112)
Creatinine, Ser: 0.88 mg/dL (ref 0.40–1.20)
GFR: 72.12 mL/min (ref >60.00)
Glucose, Bld: 89 mg/dL (ref 70–99)
POTASSIUM: 3.9 meq/L (ref 3.5–5.1)
Sodium: 139 mEq/L (ref 135–145)
Total Protein: 7.1 g/dL (ref 6.0–8.3)

## 2016-11-17 LAB — LIPID PANEL
CHOL/HDL RATIO: 5
Cholesterol: 213 mg/dL — ABNORMAL HIGH (ref 0–200)
HDL: 46 mg/dL (ref >39.00)
LDL Cholesterol: 144 mg/dL — ABNORMAL HIGH (ref 0–99)
NonHDL: 166.81
TRIGLYCERIDES: 116 mg/dL (ref 0.0–149.0)
VLDL: 23.2 mg/dL (ref 0.0–40.0)

## 2016-11-17 LAB — HEMOGLOBIN A1C: Hgb A1c MFr Bld: 5.6 % (ref 4.6–6.5)

## 2016-11-17 MED ORDER — LISINOPRIL 10 MG PO TABS: 10.0000 mg | ORAL_TABLET | Freq: Two times a day (BID) | ORAL | 5 refills | Status: DC

## 2016-11-17 NOTE — Patient Instructions (Signed)
Preventive Care 40-64 Years, Female Preventive care refers to lifestyle choices and visits with your health care provider that can promote health and wellness. What does preventive care include?  A yearly physical exam. This is also called an annual well check.  Dental exams once or twice a year.  Routine eye exams. Ask your health care provider how often you should have your eyes checked.  Personal lifestyle choices, including: ? Daily care of your teeth and gums. ? Regular physical activity. ? Eating a healthy diet. ? Avoiding tobacco and drug use. ? Limiting alcohol use. ? Practicing safe sex. ? Taking low-dose aspirin daily starting at age 58. ? Taking vitamin and mineral supplements as recommended by your health care provider. What happens during an annual well check? The services and screenings done by your health care provider during your annual well check will depend on your age, overall health, lifestyle risk factors, and family history of disease. Counseling Your health care provider may ask you questions about your:  Alcohol use.  Tobacco use.  Drug use.  Emotional well-being.  Home and relationship well-being.  Sexual activity.  Eating habits.  Work and work Statistician.  Method of birth control.  Menstrual cycle.  Pregnancy history.  Screening You may have the following tests or measurements:  Height, weight, and BMI.  Blood pressure.  Lipid and cholesterol levels. These may be checked every 5 years, or more frequently if you are over 81 years old.  Skin check.  Lung cancer screening. You may have this screening every year starting at age 78 if you have a 30-pack-year history of smoking and currently smoke or have quit within the past 15 years.  Fecal occult blood test (FOBT) of the stool. You may have this test every year starting at age 65.  Flexible sigmoidoscopy or colonoscopy. You may have a sigmoidoscopy every 5 years or a colonoscopy  every 10 years starting at age 30.  Hepatitis C blood test.  Hepatitis B blood test.  Sexually transmitted disease (STD) testing.  Diabetes screening. This is done by checking your blood sugar (glucose) after you have not eaten for a while (fasting). You may have this done every 1-3 years.  Mammogram. This may be done every 1-2 years. Talk to your health care provider about when you should start having regular mammograms. This may depend on whether you have a family history of breast cancer.  BRCA-related cancer screening. This may be done if you have a family history of breast, ovarian, tubal, or peritoneal cancers.  Pelvic exam and Pap test. This may be done every 3 years starting at age 80. Starting at age 36, this may be done every 5 years if you have a Pap test in combination with an HPV test.  Bone density scan. This is done to screen for osteoporosis. You may have this scan if you are at high risk for osteoporosis.  Discuss your test results, treatment options, and if necessary, the need for more tests with your health care provider. Vaccines Your health care provider may recommend certain vaccines, such as:  Influenza vaccine. This is recommended every year.  Tetanus, diphtheria, and acellular pertussis (Tdap, Td) vaccine. You may need a Td booster every 10 years.  Varicella vaccine. You may need this if you have not been vaccinated.  Zoster vaccine. You may need this after age 5.  Measles, mumps, and rubella (MMR) vaccine. You may need at least one dose of MMR if you were born in  1957 or later. You may also need a second dose.  Pneumococcal 13-valent conjugate (PCV13) vaccine. You may need this if you have certain conditions and were not previously vaccinated.  Pneumococcal polysaccharide (PPSV23) vaccine. You may need one or two doses if you smoke cigarettes or if you have certain conditions.  Meningococcal vaccine. You may need this if you have certain  conditions.  Hepatitis A vaccine. You may need this if you have certain conditions or if you travel or work in places where you may be exposed to hepatitis A.  Hepatitis B vaccine. You may need this if you have certain conditions or if you travel or work in places where you may be exposed to hepatitis B.  Haemophilus influenzae type b (Hib) vaccine. You may need this if you have certain conditions.  Talk to your health care provider about which screenings and vaccines you need and how often you need them. This information is not intended to replace advice given to you by your health care provider. Make sure you discuss any questions you have with your health care provider. Document Released: 06/19/2015 Document Revised: 02/10/2016 Document Reviewed: 03/24/2015 Elsevier Interactive Patient Education  2017 [employer].

## 2016-11-17 NOTE — Progress Notes (Signed)
Subjective:  I acted as a Education administrator for Dr. Charlett Blake. [name], [place]  Patient ID: Phyllis Martinez, female    DOB: Aug 27, 1965, 51 y.o.   MRN: [id number]  No chief complaint on file.   HPI  Patient is in today for an annual exam. She is following upon her HTN, hyperlipidemia and other medical concerns. Patient has no acute concerns today. She has lost over 100lbs over the last 10 months. She will continue her weight lost journey ans she feels great. No recent febrile illness or acute hospitalizations. Denies CP/palp/SOB/HA/congestion/fevers/GI or GU c/o. Taking meds as prescribed. She has been exercising regularly, maintaining a heart healthy diet and watching her intake.   Patient Care Team: Mosie Lukes, MD as PCP - General (Family Medicine)   Past Medical History:  Diagnosis Date  . ACP (advance care planning) 11/13/2014  . Allergic state 05/26/2013  . Allergy   . Arthritis    RA  . Asthma   . DM (diabetes mellitus), type 2 ([facility]) 12/24/2015  . Hypertension   . Mild intermittent asthma 11/16/2010  . Other and unspecified hyperlipidemia 04/21/2013  . Pedal edema 05/26/2013    Past Surgical History:  Procedure Laterality Date  . ADENOIDECTOMY    . CESAREAN SECTION      Family History  Problem Relation Age of Onset  . Hyperlipidemia Mother   . Hypothyroidism Mother   . Arthritis Father        RA  . Cancer Father        prostate  . Arthritis Maternal Grandmother   . Heart disease Daughter        congenital/congenital    Social History   Social History  . Marital status: Married    Spouse name: N/A  . Number of children: N/A  . Years of education: N/A   Occupational History  . Not on file.   Social History Main Topics  . Smoking status: Former Research scientist (life sciences)  . Smokeless tobacco: Never Used  . Alcohol use 0.6 oz/week    1 Glasses of wine per week  . Drug use: No  . Sexual activity: Yes     Comment: lives with husband, no dietary restriction   Other Topics Concern  . Not  on file   Social History Narrative  . No narrative on file    Outpatient Medications Prior to Visit  Medication Sig Dispense Refill  . albuterol (VENTOLIN HFA) 108 (90 Base) MCG/ACT inhaler INHALE 2 PUFFS INTO THE LUNGS EVERY 6 HOURS AS NEEDED FOR WHEEZING 18 g 11  . fluticasone (FLOVENT HFA) 110 MCG/ACT inhaler Inhale 2 puffs into the lungs 2 (two) times daily as needed (sob/wheeze). Only fill if patient requests 1 Inhaler 6  . lisinopril (PRINIVIL,ZESTRIL) 10 MG tablet TAKE 1 TABLET BY MOUTH TWICE DAILY 60 tablet 2  . Blood Glucose Monitoring Suppl ([place]) w/Device KIT Use as directed to check blood sugar. 1 each 0  . cephALEXin (KEFLEX) 500 MG capsule Take 1 capsule (500 mg total) by mouth 3 (three) times daily. 30 capsule 2  . glucose blood (ONE TOUCH TEST STRIPS) test strip Use as directed once daily to check blood sugar.  DX R73.9 100 each 5  . metFORMIN (GLUCOPHAGE) 500 MG tablet Take 1 tablet (500 mg total) by mouth daily. 30 tablet 3  . ONE TOUCH LANCETS MISC Use as directed once daily to check blood sugar.. DX R73.9 200 each 5   No facility-administered medications prior to visit.  Allergies  Allergen Reactions  . Advair Diskus [Fluticasone-Salmeterol]   . Chloraseptic Sore Throat [Acetaminophen]   . Hyzaar [Losartan Potassium-Hctz] Swelling  . Metoprolol     edema    Review of Systems  Constitutional: Negative for fever and malaise/fatigue.  HENT: Negative for congestion.   Eyes: Negative for blurred vision.  Respiratory: Negative for cough and shortness of breath.   Cardiovascular: Negative for chest pain, palpitations and leg swelling.  Gastrointestinal: Negative for vomiting.  Musculoskeletal: Negative for back pain.  Skin: Negative for rash.  Neurological: Negative for loss of consciousness and headaches.       Objective:    Physical Exam  Constitutional: She is oriented to person, place, and time. She appears well-developed and  well-nourished. No distress.  HENT:  Head: Normocephalic and atraumatic.  Eyes: Conjunctivae are normal.  Neck: Normal range of motion. No thyromegaly present.  Cardiovascular: Normal rate and regular rhythm.   Pulmonary/Chest: Effort normal and breath sounds normal. She has no wheezes.  Abdominal: Soft. Bowel sounds are normal. There is no tenderness.  Musculoskeletal: Normal range of motion. She exhibits no edema or deformity.  Neurological: She is alert and oriented to person, place, and time.  Skin: Skin is warm and dry. She is not diaphoretic.  Psychiatric: She has a normal mood and affect.    BP 114/82 (BP Location: Left Arm, Patient Position: Sitting, Cuff Size: Normal)   Pulse (!) 54   Temp 98.1 F (36.7 C) (Oral)   Resp 10   Ht '5\' 5"'  (1.651 m)   Wt 209 lb 9.6 oz (95.1 kg)   SpO2 98%   BMI 34.88 kg/m  Wt Readings from Last 3 Encounters:  11/17/16 209 lb 9.6 oz (95.1 kg)  12/24/15 (!) 303 lb 4 oz (137.6 kg)  02/16/15 291 lb (132 kg)   BP Readings from Last 3 Encounters:  11/17/16 114/82  12/24/15 (!) 132/92  02/16/15 126/82     Immunization History  Administered Date(s) Administered  . Influenza-Unspecified 04/05/2013, 03/06/2014, 03/25/2015, 03/09/2016  . Tdap 11/16/2010    Health Maintenance  Topic Date Due  . PNEUMOCOCCAL POLYSACCHARIDE VACCINE (1) 04/15/1968  . FOOT EXAM  04/15/1976  . OPHTHALMOLOGY EXAM  04/15/1976  . HIV Screening  04/15/1981  . PAP SMEAR  10/20/2012  . MAMMOGRAM  08/01/2013  . COLONOSCOPY  04/15/2016  . INFLUENZA VACCINE  01/04/2017  . HEMOGLOBIN A1C  05/19/2017  . TETANUS/TDAP  11/15/2020    Lab Results  Component Value Date   WBC 5.9 11/17/2016   HGB 14.7 11/17/2016   HCT 43.4 11/17/2016   PLT 310.0 11/17/2016   GLUCOSE 89 11/17/2016   CHOL 213 (H) 11/17/2016   TRIG 116.0 11/17/2016   HDL 46.00 11/17/2016   LDLDIRECT 132.0 12/24/2015   LDLCALC 144 (H) 11/17/2016   ALT 15 11/17/2016   AST 17 11/17/2016   NA 139  11/17/2016   K 3.9 11/17/2016   CL 104 11/17/2016   CREATININE 0.88 11/17/2016   BUN 16 11/17/2016   CO2 30 11/17/2016   TSH 0.76 11/17/2016   HGBA1C 5.6 11/17/2016    Lab Results  Component Value Date   TSH 0.76 11/17/2016   Lab Results  Component Value Date   WBC 5.9 11/17/2016   HGB 14.7 11/17/2016   HCT 43.4 11/17/2016   MCV 89.3 11/17/2016   PLT 310.0 11/17/2016   Lab Results  Component Value Date   NA 139 11/17/2016   K 3.9 11/17/2016   CO2  30 11/17/2016   GLUCOSE 89 11/17/2016   BUN 16 11/17/2016   CREATININE 0.88 11/17/2016   BILITOT 0.7 11/17/2016   ALKPHOS 42 11/17/2016   AST 17 11/17/2016   ALT 15 11/17/2016   PROT 7.1 11/17/2016   ALBUMIN 4.2 11/17/2016   CALCIUM 10.1 11/17/2016   GFR 72.12 11/17/2016   Lab Results  Component Value Date   CHOL 213 (H) 11/17/2016   Lab Results  Component Value Date   HDL 46.00 11/17/2016   Lab Results  Component Value Date   LDLCALC 144 (H) 11/17/2016   Lab Results  Component Value Date   TRIG 116.0 11/17/2016   Lab Results  Component Value Date   CHOLHDL 5 11/17/2016   Lab Results  Component Value Date   HGBA1C 5.6 11/17/2016         Assessment & Plan:   Problem List Items Addressed This Visit    HTN (hypertension)    Well controlled, no changes to meds. Encouraged heart healthy diet such as the DASH diet and exercise as tolerated.       Relevant Medications   lisinopril (PRINIVIL,ZESTRIL) 10 MG tablet   Other Relevant Orders   CBC (Completed)   Comprehensive metabolic panel (Completed)   TSH (Completed)   Obesity    Great weight loss over past year with changes in diet and an increase in exercise. She has been limiting serving size and increase exercise. Consider bariatric program If worsens.      Annual physical exam - Primary    Patient encouraged to maintain heart healthy diet, regular exercise, adequate sleep. Consider daily probiotics. Take medications as prescribed. Labs  reviewed      Relevant Orders   Hemoglobin A1c (Completed)   CBC (Completed)   Comprehensive metabolic panel (Completed)   Lipid panel (Completed)   TSH (Completed)   Hyperlipidemia, mixed    Encouraged heart healthy diet, increase exercise, avoid trans fats, consider a krill oil cap daily      Relevant Medications   lisinopril (PRINIVIL,ZESTRIL) 10 MG tablet   Other Relevant Orders   Lipid panel (Completed)   DM (diabetes mellitus), type 2 ([facility])    hgba1c acceptable, minimize simple carbs. Increase exercise as tolerated. Continue current meds. Great improvement      Relevant Medications   lisinopril (PRINIVIL,ZESTRIL) 10 MG tablet    Other Visit Diagnoses    Cervical cancer screening       Relevant Orders   Ambulatory referral to Obstetrics / Gynecology   Breast cancer screening       Relevant Orders   MM SCREENING BREAST TOMO BILATERAL      I have discontinued Ms. Burmaster's ONE TOUCH LANCETS, metFORMIN, glucose blood, ONE TOUCH BASIC SYSTEM, cephALEXin, and lisinopril. I am also having her start on lisinopril. Additionally, I am having her maintain her fluticasone and albuterol.  Meds ordered this encounter  Medications  . lisinopril (PRINIVIL,ZESTRIL) 10 MG tablet    Sig: Take 1 tablet (10 mg total) by mouth 2 (two) times daily.    Dispense:  60 tablet    Refill:  5    CMA served as scribe during this visit. History, Physical and Plan performed by medical provider. Documentation and orders reviewed and attested to.  Penni Homans, MD

## 2016-11-18 ENCOUNTER — Other Ambulatory Visit: Payer: Self-pay | Admitting: Family Medicine

## 2016-11-18 DIAGNOSIS — R928 Other abnormal and inconclusive findings on diagnostic imaging of breast: Secondary | ICD-10-CM

## 2016-11-20 NOTE — Assessment & Plan Note (Addendum)
Great weight loss over past year with changes in diet and an increase in exercise. She has been limiting serving size and increase exercise. Consider bariatric program If worsens.

## 2016-11-20 NOTE — Assessment & Plan Note (Signed)
Patient encouraged to maintain heart healthy diet, regular exercise, adequate sleep. Consider daily probiotics. Take medications as prescribed. Labs reviewed 

## 2016-11-20 NOTE — Assessment & Plan Note (Signed)
hgba1c acceptable, minimize simple carbs. Increase exercise as tolerated. Continue current meds. Great improvement

## 2016-11-20 NOTE — Assessment & Plan Note (Signed)
Well controlled, no changes to meds. Encouraged heart healthy diet such as the DASH diet and exercise as tolerated.  °

## 2016-11-20 NOTE — Assessment & Plan Note (Signed)
Encouraged heart healthy diet, increase exercise, avoid trans fats, consider a krill oil cap daily 

## 2016-11-23 ENCOUNTER — Encounter: Payer: Self-pay | Admitting: Family Medicine

## 2016-11-23 LAB — COLOGUARD

## 2016-11-30 LAB — COLOGUARD: COLOGUARD: NEGATIVE

## 2016-12-01 ENCOUNTER — Telehealth: Payer: Self-pay | Admitting: *Deleted

## 2016-12-01 NOTE — Telephone Encounter (Signed)
IFOB result negative. Result abstracted and emailed to pt.

## 2016-12-13 ENCOUNTER — Encounter: Payer: Self-pay | Admitting: Family Medicine

## 2017-01-03 ENCOUNTER — Other Ambulatory Visit: Payer: Self-pay | Admitting: Family Medicine

## 2017-12-24 ENCOUNTER — Other Ambulatory Visit: Payer: Self-pay | Admitting: Family Medicine

## 2017-12-25 ENCOUNTER — Encounter: Payer: Self-pay | Admitting: Family Medicine

## 2017-12-25 NOTE — Telephone Encounter (Signed)
Pt last seen 11/17/16 and is past due for follow up. 30 day supply of lisinopril sent to the pharmacy. Mychart message sent to pt to schedule appt.

## 2018-02-01 ENCOUNTER — Encounter: Payer: Managed Care, Other (non HMO) | Admitting: Family Medicine

## 2018-02-12 ENCOUNTER — Encounter: Payer: Managed Care, Other (non HMO) | Admitting: Family Medicine

## 2018-02-24 ENCOUNTER — Other Ambulatory Visit: Payer: Self-pay | Admitting: Family Medicine

## 2018-03-15 ENCOUNTER — Encounter: Payer: Managed Care, Other (non HMO) | Admitting: Family Medicine

## 2018-04-05 ENCOUNTER — Ambulatory Visit (INDEPENDENT_AMBULATORY_CARE_PROVIDER_SITE_OTHER): Payer: 59 | Admitting: Family Medicine

## 2018-04-05 ENCOUNTER — Encounter: Payer: Self-pay | Admitting: Family Medicine

## 2018-04-05 VITALS — BP 126/82 | HR 76 | Temp 98.1°F | Resp 18 | Ht 63.0 in | Wt 296.4 lb

## 2018-04-05 DIAGNOSIS — E1169 Type 2 diabetes mellitus with other specified complication: Secondary | ICD-10-CM

## 2018-04-05 DIAGNOSIS — I10 Essential (primary) hypertension: Secondary | ICD-10-CM

## 2018-04-05 DIAGNOSIS — H6992 Unspecified Eustachian tube disorder, left ear: Secondary | ICD-10-CM | POA: Insufficient documentation

## 2018-04-05 DIAGNOSIS — Z0000 Encounter for general adult medical examination without abnormal findings: Secondary | ICD-10-CM

## 2018-04-05 DIAGNOSIS — E669 Obesity, unspecified: Secondary | ICD-10-CM

## 2018-04-05 DIAGNOSIS — T7840XD Allergy, unspecified, subsequent encounter: Secondary | ICD-10-CM | POA: Diagnosis not present

## 2018-04-05 DIAGNOSIS — H6982 Other specified disorders of Eustachian tube, left ear: Secondary | ICD-10-CM | POA: Insufficient documentation

## 2018-04-05 DIAGNOSIS — E782 Mixed hyperlipidemia: Secondary | ICD-10-CM | POA: Diagnosis not present

## 2018-04-05 LAB — CBC
HCT: 42.5 % (ref 36.0–46.0)
HEMOGLOBIN: 14.4 g/dL (ref 12.0–15.0)
MCHC: 34 g/dL (ref 30.0–36.0)
MCV: 89.6 fl (ref 78.0–100.0)
PLATELETS: 293 10*3/uL (ref 150.0–400.0)
RBC: 4.75 Mil/uL (ref 3.87–5.11)
RDW: 13.5 % (ref 11.5–15.5)
WBC: 9.2 10*3/uL (ref 4.0–10.5)

## 2018-04-05 LAB — COMPREHENSIVE METABOLIC PANEL
ALK PHOS: 47 U/L (ref 39–117)
ALT: 13 U/L (ref 0–35)
AST: 15 U/L (ref 0–37)
Albumin: 4 g/dL (ref 3.5–5.2)
BILIRUBIN TOTAL: 0.5 mg/dL (ref 0.2–1.2)
BUN: 14 mg/dL (ref 6–23)
CALCIUM: 9.2 mg/dL (ref 8.4–10.5)
CO2: 27 meq/L (ref 19–32)
Chloride: 102 mEq/L (ref 96–112)
Creatinine, Ser: 0.77 mg/dL (ref 0.40–1.20)
GFR: 83.68 mL/min (ref >60.00)
Glucose, Bld: 111 mg/dL — ABNORMAL HIGH (ref 70–99)
Potassium: 4.3 mEq/L (ref 3.5–5.1)
Sodium: 136 mEq/L (ref 135–145)
TOTAL PROTEIN: 6.7 g/dL (ref 6.0–8.3)

## 2018-04-05 LAB — LIPID PANEL
Cholesterol: 229 mg/dL — ABNORMAL HIGH (ref 0–200)
HDL: 40.1 mg/dL (ref >39.00)
NonHDL: 188.43
Total CHOL/HDL Ratio: 6
Triglycerides: 205 mg/dL — ABNORMAL HIGH (ref 0.0–149.0)
VLDL: 41 mg/dL — ABNORMAL HIGH (ref 0.0–40.0)

## 2018-04-05 LAB — HEMOGLOBIN A1C: Hgb A1c MFr Bld: 6.9 % — ABNORMAL HIGH (ref 4.6–6.5)

## 2018-04-05 LAB — TSH: TSH: 1.49 u[IU]/mL (ref 0.35–4.50)

## 2018-04-05 LAB — LDL CHOLESTEROL, DIRECT: LDL DIRECT: 164 mg/dL

## 2018-04-05 MED ORDER — BUPROPION HCL ER (XL) 300 MG PO TB24: 300.0000 mg | ORAL_TABLET | Freq: Every day | ORAL | 3 refills | Status: DC

## 2018-04-05 MED ORDER — BUPROPION HCL ER (XL) 150 MG PO TB24: 150.0000 mg | ORAL_TABLET | Freq: Every day | ORAL | 0 refills | Status: DC

## 2018-04-05 MED ORDER — ALBUTEROL SULFATE HFA 108 (90 BASE) MCG/ACT IN AERS: INHALATION_SPRAY | RESPIRATORY_TRACT | 1 refills | Status: DC

## 2018-04-05 MED ORDER — MOMETASONE FUROATE 50 MCG/ACT NA SUSP: 2.0000 | Freq: Every day | NASAL | 12 refills | Status: DC

## 2018-04-05 MED ORDER — FLUTICASONE PROPIONATE HFA 110 MCG/ACT IN AERO: INHALATION_SPRAY | RESPIRATORY_TRACT | 1 refills | Status: DC

## 2018-04-05 MED ORDER — LISINOPRIL 10 MG PO TABS: 10.0000 mg | ORAL_TABLET | Freq: Two times a day (BID) | ORAL | 1 refills | Status: DC

## 2018-04-05 NOTE — Assessment & Plan Note (Signed)
Has had trouble since childhood off and on since have her adenoids and tonsils out and tubes placed. Describes an uncomfortable pressure. Exacerbated by wearing a head set at work will refer to ENT and start nasonex

## 2018-04-05 NOTE — Progress Notes (Signed)
Subjective:    Patient ID: Phyllis Martinez, female    DOB: [birth year], 52 y.o.   MRN: [id number]  No chief complaint on file.   HPI Patient is in today for annual preventative exam and follow up on chronic medical concerns such as obesity, hyperlipidemia, diabetes, and more. No polyuria or polydipsia. She is noting frustration with poor sleep and weight gain. She is stressed over daughter's divorce. She is frustrated with her left ear pressure and discomfort off and on since childhood after tubes and adenoidectomy and is worse lately. No recent febrile illness or hospitalization. She is trying to maintain a heart healthy diet and stay active and does better some days or others. Denies CP/palp/SOB/HA/congestion/fevers/GI or GU c/o. Taking meds as prescribed.  Past Medical History:  Diagnosis Date  . ACP (advance care planning) 11/13/2014  . Allergic state 05/26/2013  . Allergy   . Arthritis    RA  . Asthma   . DM (diabetes mellitus), type 2 (HCC) 12/24/2015  . Hypertension   . Mild intermittent asthma 11/16/2010  . Other and unspecified hyperlipidemia 04/21/2013  . Pedal edema 05/26/2013    Past Surgical History:  Procedure Laterality Date  . ADENOIDECTOMY    . CESAREAN SECTION      Family History  Problem Relation Age of Onset  . Hyperlipidemia Mother   . Hypothyroidism Mother   . Arthritis Father        RA  . Cancer Father        prostate  . Arthritis Maternal Grandmother   . Heart disease Daughter        congenital/congenital    Social History   Socioeconomic History  . Marital status: Married    Spouse name: Not on file  . Number of children: Not on file  . Years of education: Not on file  . Highest education level: Not on file  Occupational History  . Not on file  Social Needs  . Financial resource strain: Not on file  . Food insecurity:    Worry: Not on file    Inability: Not on file  . Transportation needs:    Medical: Not on file    Non-medical: Not on  file  Tobacco Use  . Smoking status: Former Games developer  . Smokeless tobacco: Never Used  Substance and Sexual Activity  . Alcohol use: Yes    Alcohol/week: 1.0 standard drinks    Types: 1 Glasses of wine per week  . Drug use: No  . Sexual activity: Yes    Comment: lives with husband, no dietary restriction  Lifestyle  . Physical activity:    Days per week: Not on file    Minutes per session: Not on file  . Stress: Not on file  Relationships  . Social connections:    Talks on phone: Not on file    Gets together: Not on file    Attends religious service: Not on file    Active member of club or organization: Not on file    Attends meetings of clubs or organizations: Not on file    Relationship status: Not on file  . Intimate partner violence:    Fear of current or ex partner: Not on file    Emotionally abused: Not on file    Physically abused: Not on file    Forced sexual activity: Not on file  Other Topics Concern  . Not on file  Social History Narrative  . Not on file  Outpatient Medications Prior to Visit  Medication Sig Dispense Refill  . FLOVENT HFA 110 MCG/ACT inhaler INHALE 2 PUFFS INTO THE LUNGS TWICE DAILY AS NEEDED FOR SHORTNESS OF BREATH OR WHEEZING 12 g 0  . lisinopril (PRINIVIL,ZESTRIL) 10 MG tablet Take 1 tablet (10 mg total) by mouth 2 (two) times daily. 60 tablet 1  . VENTOLIN HFA 108 (90 Base) MCG/ACT inhaler INHALE 2 PUFFS INTO THE LUNGS EVERY 6 HOURS AS NEEDED FOR WHEEZING 18 g 0   No facility-administered medications prior to visit.     Allergies  Allergen Reactions  . Advair Diskus [Fluticasone-Salmeterol]   . Chloraseptic Sore Throat [Acetaminophen]   . Hyzaar [Losartan Potassium-Hctz] Swelling  . Metoprolol     edema    Review of Systems  Constitutional: Negative for chills, fever and malaise/fatigue.  HENT: Negative for congestion and hearing loss.   Eyes: Negative for discharge.  Respiratory: Negative for cough, sputum production and  shortness of breath.   Cardiovascular: Negative for chest pain, palpitations and leg swelling.  Gastrointestinal: Negative for abdominal pain, blood in stool, constipation, diarrhea, heartburn, nausea and vomiting.  Genitourinary: Negative for dysuria, frequency, hematuria and urgency.  Musculoskeletal: Negative for back pain, falls and myalgias.  Skin: Negative for rash.  Neurological: Negative for dizziness, sensory change, loss of consciousness, weakness and headaches.  Endo/Heme/Allergies: Negative for environmental allergies. Does not bruise/bleed easily.  Psychiatric/Behavioral: Negative for depression and suicidal ideas. The patient is not nervous/anxious and does not have insomnia.        Objective:    Physical Exam  Constitutional: She is oriented to person, place, and time. She appears well-developed and well-nourished. No distress.  HENT:  Head: Normocephalic and atraumatic.  Eyes: Conjunctivae are normal.  Neck: Neck supple. No thyromegaly present.  Cardiovascular: Normal rate, regular rhythm and normal heart sounds.  No murmur heard. Pulmonary/Chest: Effort normal and breath sounds normal. No respiratory distress.  Abdominal: Soft. Bowel sounds are normal. She exhibits no distension and no mass. There is no tenderness.  Musculoskeletal: She exhibits no edema.  Lymphadenopathy:    She has no cervical adenopathy.  Neurological: She is alert and oriented to person, place, and time.  Skin: Skin is warm and dry.  Psychiatric: She has a normal mood and affect. Her behavior is normal.    BP 126/82 (BP Location: Left Arm, Patient Position: Sitting, Cuff Size: Normal)   Pulse 76   Temp 98.1 F (36.7 C) (Oral)   Resp 18   Ht 5\' 3"  (1.6 m)   Wt 296 lb 6.4 oz (134.4 kg)   SpO2 98%   BMI 52.50 kg/m  Wt Readings from Last 3 Encounters:  04/05/18 296 lb 6.4 oz (134.4 kg)  11/17/16 209 lb 9.6 oz (95.1 kg)  12/24/15 (!) 303 lb 4 oz (137.6 kg)     Lab Results  Component  Value Date   WBC 9.2 04/05/2018   HGB 14.4 04/05/2018   HCT 42.5 04/05/2018   PLT 293.0 04/05/2018   GLUCOSE 111 (H) 04/05/2018   CHOL 229 (H) 04/05/2018   TRIG 205.0 (H) 04/05/2018   HDL 40.10 04/05/2018   LDLDIRECT 164.0 04/05/2018   LDLCALC 144 (H) 11/17/2016   ALT 13 04/05/2018   AST 15 04/05/2018   NA 136 04/05/2018   K 4.3 04/05/2018   CL 102 04/05/2018   CREATININE 0.77 04/05/2018   BUN 14 04/05/2018   CO2 27 04/05/2018   TSH 1.49 04/05/2018   HGBA1C 6.9 (H) 04/05/2018  Lab Results  Component Value Date   TSH 1.49 04/05/2018   Lab Results  Component Value Date   WBC 9.2 04/05/2018   HGB 14.4 04/05/2018   HCT 42.5 04/05/2018   MCV 89.6 04/05/2018   PLT 293.0 04/05/2018   Lab Results  Component Value Date   NA 136 04/05/2018   K 4.3 04/05/2018   CO2 27 04/05/2018   GLUCOSE 111 (H) 04/05/2018   BUN 14 04/05/2018   CREATININE 0.77 04/05/2018   BILITOT 0.5 04/05/2018   ALKPHOS 47 04/05/2018   AST 15 04/05/2018   ALT 13 04/05/2018   PROT 6.7 04/05/2018   ALBUMIN 4.0 04/05/2018   CALCIUM 9.2 04/05/2018   GFR 83.68 04/05/2018   Lab Results  Component Value Date   CHOL 229 (H) 04/05/2018   Lab Results  Component Value Date   HDL 40.10 04/05/2018   Lab Results  Component Value Date   LDLCALC 144 (H) 11/17/2016   Lab Results  Component Value Date   TRIG 205.0 (H) 04/05/2018   Lab Results  Component Value Date   CHOLHDL 6 04/05/2018   Lab Results  Component Value Date   HGBA1C 6.9 (H) 04/05/2018       Assessment & Plan:   Problem List Items Addressed This Visit    HTN (hypertension)    Well controlled, no changes to meds. Encouraged heart healthy diet such as the DASH diet and exercise as tolerated.       Relevant Medications   lisinopril (PRINIVIL,ZESTRIL) 10 MG tablet   Other Relevant Orders   CBC (Completed)   TSH (Completed)   Obesity    Encouraged DASH diet, decrease po intake and increase exercise as tolerated. Needs 7-8  hours of sleep nightly. Avoid trans fats, eat small, frequent meals every 4-5 hours with lean proteins, complex carbs and healthy fats. Minimize simple carbs. Will check insulin level and consider metformin. Started on Wellbutrin XL 150 mg daily x 4 days then increase to 300 mg daily      Relevant Medications   metFORMIN (GLUCOPHAGE) 500 MG tablet   Hyperlipidemia, mixed - Primary    Encouraged heart healthy diet, increase exercise, avoid trans fats, consider a krill oil cap daily      Relevant Medications   lisinopril (PRINIVIL,ZESTRIL) 10 MG tablet   Other Relevant Orders   Lipid panel (Completed)   Allergy    Try Nasonex and nasal saline and valsalva maneuver.      Preventative health care    Patient encouraged to maintain heart healthy diet, regular exercise, adequate sleep. Consider daily probiotics. Take medications as prescribed. Given and reviewed copy of ACP documents from [facility] Secretary of State and encouraged to complete and return      DM (diabetes mellitus), type 2 (HCC)    hgba1c acceptable, minimize simple carbs. Increase exercise as tolerated. Start Metfomin and reevaluate      Relevant Medications   lisinopril (PRINIVIL,ZESTRIL) 10 MG tablet   metFORMIN (GLUCOPHAGE) 500 MG tablet   Other Relevant Orders   Hemoglobin A1c (Completed)   Comprehensive metabolic panel (Completed)   Insulin-like growth factor   Eustachian tube dysfunction, left    Has had trouble since childhood off and on since have her adenoids and tonsils out and tubes placed. Describes an uncomfortable pressure. Exacerbated by wearing a head set at work will refer to ENT and start nasonex         I have changed Cartha Daise's FLOVENT HFA to fluticasone  and VENTOLIN HFA to albuterol. I am also having her start on mometasone, buPROPion, buPROPion, and metFORMIN. Additionally, I am having her maintain her lisinopril.  Meds ordered this encounter  Medications  . lisinopril (PRINIVIL,ZESTRIL) 10 MG  tablet    Sig: Take 1 tablet (10 mg total) by mouth 2 (two) times daily.    Dispense:  60 tablet    Refill:  1  . fluticasone (FLOVENT HFA) 110 MCG/ACT inhaler    Sig: INHALE 2 PUFFS INTO THE LUNGS TWICE DAILY AS NEEDED FOR SHORTNESS OF BREATH OR WHEEZING    Dispense:  12 g    Refill:  1  . albuterol (VENTOLIN HFA) 108 (90 Base) MCG/ACT inhaler    Sig: INHALE 2 PUFFS INTO THE LUNGS EVERY 6 HOURS AS NEEDED FOR WHEEZING    Dispense:  18 g    Refill:  1  . mometasone (NASONEX) 50 MCG/ACT nasal spray    Sig: Place 2 sprays into the nose daily.    Dispense:  17 g    Refill:  12  . buPROPion (WELLBUTRIN XL) 150 MG 24 hr tablet    Sig: Take 1 tablet (150 mg total) by mouth daily.    Dispense:  4 tablet    Refill:  0  . buPROPion (WELLBUTRIN XL) 300 MG 24 hr tablet    Sig: Take 1 tablet (300 mg total) by mouth daily.    Dispense:  30 tablet    Refill:  3  . metFORMIN (GLUCOPHAGE) 500 MG tablet    Sig: Take 1 tablet (500 mg total) by mouth daily.    Dispense:  30 tablet    Refill:  3     Danise Edge, MD

## 2018-04-05 NOTE — Assessment & Plan Note (Addendum)
hgba1c acceptable, minimize simple carbs. Increase exercise as tolerated. Start Metfomin and reevaluate

## 2018-04-05 NOTE — Patient Instructions (Addendum)
Shingrix is the new shingles shot. 2 shots over 2-6 months  Preventive Care 40-64 Years, Female Preventive care refers to lifestyle choices and visits with your health care provider that can promote health and wellness. What does preventive care include?  A yearly physical exam. This is also called an annual well check.  Dental exams once or twice a year.  Routine eye exams. Ask your health care provider how often you should have your eyes checked.  Personal lifestyle choices, including: ? Daily care of your teeth and gums. ? Regular physical activity. ? Eating a healthy diet. ? Avoiding tobacco and drug use. ? Limiting alcohol use. ? Practicing safe sex. ? Taking low-dose aspirin daily starting at age 48. ? Taking vitamin and mineral supplements as recommended by your health care provider. What happens during an annual well check? The services and screenings done by your health care provider during your annual well check will depend on your age, overall health, lifestyle risk factors, and family history of disease. Counseling Your health care provider may ask you questions about your:  Alcohol use.  Tobacco use.  Drug use.  Emotional well-being.  Home and relationship well-being.  Sexual activity.  Eating habits.  Work and work Statistician.  Method of birth control.  Menstrual cycle.  Pregnancy history.  Screening You may have the following tests or measurements:  Height, weight, and BMI.  Blood pressure.  Lipid and cholesterol levels. These may be checked every 5 years, or more frequently if you are over 81 years old.  Skin check.  Lung cancer screening. You may have this screening every year starting at age 55 if you have a 30-pack-year history of smoking and currently smoke or have quit within the past 15 years.  Fecal occult blood test (FOBT) of the stool. You may have this test every year starting at age 89.  Flexible sigmoidoscopy or colonoscopy.  You may have a sigmoidoscopy every 5 years or a colonoscopy every 10 years starting at age 56.  Hepatitis C blood test.  Hepatitis B blood test.  Sexually transmitted disease (STD) testing.  Diabetes screening. This is done by checking your blood sugar (glucose) after you have not eaten for a while (fasting). You may have this done every 1-3 years.  Mammogram. This may be done every 1-2 years. Talk to your health care provider about when you should start having regular mammograms. This may depend on whether you have a family history of breast cancer.  BRCA-related cancer screening. This may be done if you have a family history of breast, ovarian, tubal, or peritoneal cancers.  Pelvic exam and Pap test. This may be done every 3 years starting at age 25. Starting at age 6, this may be done every 5 years if you have a Pap test in combination with an HPV test.  Bone density scan. This is done to screen for osteoporosis. You may have this scan if you are at high risk for osteoporosis.  Discuss your test results, treatment options, and if necessary, the need for more tests with your health care provider. Vaccines Your health care provider may recommend certain vaccines, such as:  Influenza vaccine. This is recommended every year.  Tetanus, diphtheria, and acellular pertussis (Tdap, Td) vaccine. You may need a Td booster every 10 years.  Varicella vaccine. You may need this if you have not been vaccinated.  Zoster vaccine. You may need this after age 50.  Measles, mumps, and rubella (MMR) vaccine. You may  need at least one dose of MMR if you were born in 1957 or later. You may also need a second dose.  Pneumococcal 13-valent conjugate (PCV13) vaccine. You may need this if you have certain conditions and were not previously vaccinated.  Pneumococcal polysaccharide (PPSV23) vaccine. You may need one or two doses if you smoke cigarettes or if you have certain conditions.  Meningococcal  vaccine. You may need this if you have certain conditions.  Hepatitis A vaccine. You may need this if you have certain conditions or if you travel or work in places where you may be exposed to hepatitis A.  Hepatitis B vaccine. You may need this if you have certain conditions or if you travel or work in places where you may be exposed to hepatitis B.  Haemophilus influenzae type b (Hib) vaccine. You may need this if you have certain conditions.  Talk to your health care provider about which screenings and vaccines you need and how often you need them. This information is not intended to replace advice given to you by your health care provider. Make sure you discuss any questions you have with your health care provider. Document Released: 06/19/2015 Document Revised: 02/10/2016 Document Reviewed: 03/24/2015 Elsevier Interactive Patient Education  [employer].

## 2018-04-05 NOTE — Assessment & Plan Note (Signed)
Well controlled, no changes to meds. Encouraged heart healthy diet such as the DASH diet and exercise as tolerated.  °

## 2018-04-05 NOTE — Assessment & Plan Note (Addendum)
Encouraged DASH diet, decrease po intake and increase exercise as tolerated. Needs 7-8 hours of sleep nightly. Avoid trans fats, eat small, frequent meals every 4-5 hours with lean proteins, complex carbs and healthy fats. Minimize simple carbs. Will check insulin level and consider metformin. Started on Wellbutrin XL 150 mg daily x 4 days then increase to 300 mg daily

## 2018-04-05 NOTE — Assessment & Plan Note (Signed)
Encouraged heart healthy diet, increase exercise, avoid trans fats, consider a krill oil cap daily 

## 2018-04-05 NOTE — Assessment & Plan Note (Signed)
Try Nasonex and nasal saline and valsalva maneuver.

## 2018-04-06 MED ORDER — METFORMIN HCL 500 MG PO TABS: 500.0000 mg | ORAL_TABLET | Freq: Every day | ORAL | 3 refills | Status: DC

## 2018-04-08 LAB — INSULIN-LIKE GROWTH FACTOR
IGF-I, LC/MS: 74 ng/mL (ref 50–317)
Z-Score (Female): -1.3 SD (ref ?–2.0)

## 2018-04-08 NOTE — Assessment & Plan Note (Signed)
Patient encouraged to maintain heart healthy diet, regular exercise, adequate sleep. Consider daily probiotics. Take medications as prescribed. Given and reviewed copy of ACP documents from  Secretary of State and encouraged to complete and return 

## 2018-04-10 ENCOUNTER — Encounter: Payer: Self-pay | Admitting: Family Medicine

## 2018-05-07 ENCOUNTER — Ambulatory Visit (INDEPENDENT_AMBULATORY_CARE_PROVIDER_SITE_OTHER): Payer: 59

## 2018-05-07 DIAGNOSIS — Z23 Encounter for immunization: Secondary | ICD-10-CM | POA: Diagnosis not present

## 2018-07-06 ENCOUNTER — Ambulatory Visit: Payer: 59 | Admitting: Family Medicine

## 2018-07-15 ENCOUNTER — Other Ambulatory Visit: Payer: Self-pay | Admitting: Family Medicine

## 2018-07-16 ENCOUNTER — Ambulatory Visit (INDEPENDENT_AMBULATORY_CARE_PROVIDER_SITE_OTHER): Payer: 59 | Admitting: Family Medicine

## 2018-07-16 DIAGNOSIS — I10 Essential (primary) hypertension: Secondary | ICD-10-CM

## 2018-07-16 DIAGNOSIS — E782 Mixed hyperlipidemia: Secondary | ICD-10-CM

## 2018-07-16 DIAGNOSIS — Z23 Encounter for immunization: Secondary | ICD-10-CM | POA: Diagnosis not present

## 2018-07-16 DIAGNOSIS — E1169 Type 2 diabetes mellitus with other specified complication: Secondary | ICD-10-CM | POA: Diagnosis not present

## 2018-07-16 DIAGNOSIS — E669 Obesity, unspecified: Secondary | ICD-10-CM | POA: Diagnosis not present

## 2018-07-16 DIAGNOSIS — F418 Other specified anxiety disorders: Secondary | ICD-10-CM

## 2018-07-16 LAB — LIPID PANEL
CHOL/HDL RATIO: 7
Cholesterol: 231 mg/dL — ABNORMAL HIGH (ref 0–200)
HDL: 34.7 mg/dL — ABNORMAL LOW (ref >39.00)
NonHDL: 196.39
Triglycerides: 369 mg/dL — ABNORMAL HIGH (ref 0.0–149.0)
VLDL: 73.8 mg/dL — ABNORMAL HIGH (ref 0.0–40.0)

## 2018-07-16 LAB — COMPREHENSIVE METABOLIC PANEL
ALT: 14 U/L (ref 0–35)
AST: 14 U/L (ref 0–37)
Albumin: 4 g/dL (ref 3.5–5.2)
Alkaline Phosphatase: 53 U/L (ref 39–117)
BUN: 12 mg/dL (ref 6–23)
CHLORIDE: 102 meq/L (ref 96–112)
CO2: 29 meq/L (ref 19–32)
Calcium: 9.7 mg/dL (ref 8.4–10.5)
Creatinine, Ser: 0.83 mg/dL (ref 0.40–1.20)
GFR: 72.12 mL/min (ref >60.00)
Glucose, Bld: 96 mg/dL (ref 70–99)
Potassium: 4 mEq/L (ref 3.5–5.1)
Sodium: 139 mEq/L (ref 135–145)
Total Bilirubin: 0.4 mg/dL (ref 0.2–1.2)
Total Protein: 6.8 g/dL (ref 6.0–8.3)

## 2018-07-16 LAB — CBC
HCT: 42.8 % (ref 36.0–46.0)
Hemoglobin: 14.5 g/dL (ref 12.0–15.0)
MCHC: 33.9 g/dL (ref 30.0–36.0)
MCV: 89 fl (ref 78.0–100.0)
Platelets: 314 10*3/uL (ref 150.0–400.0)
RBC: 4.81 Mil/uL (ref 3.87–5.11)
RDW: 13.1 % (ref 11.5–15.5)
WBC: 8.1 10*3/uL (ref 4.0–10.5)

## 2018-07-16 LAB — TSH: TSH: 1.35 u[IU]/mL (ref 0.35–4.50)

## 2018-07-16 LAB — LDL CHOLESTEROL, DIRECT: Direct LDL: 143 mg/dL

## 2018-07-16 LAB — HEMOGLOBIN A1C: Hgb A1c MFr Bld: 6.3 % (ref 4.6–6.5)

## 2018-07-16 NOTE — Patient Instructions (Signed)
Carbohydrate Counting for Diabetes Mellitus, Adult  Carbohydrate counting is a method of keeping track of how many carbohydrates you eat. Eating carbohydrates naturally increases the amount of sugar (glucose) in the blood. Counting how many carbohydrates you eat helps keep your blood glucose within normal limits, which helps you manage your diabetes (diabetes mellitus). It is important to know how many carbohydrates you can safely have in each meal. This is different for every person. A diet and nutrition specialist (registered dietitian) can help you make a meal plan and calculate how many carbohydrates you should have at each meal and snack. Carbohydrates are found in the following foods:  Grains, such as breads and cereals.  Dried beans and soy products.  Starchy vegetables, such as potatoes, peas, and corn.  Fruit and fruit juices.  Milk and yogurt.  Sweets and snack foods, such as cake, cookies, candy, chips, and soft drinks. How do I count carbohydrates? There are two ways to count carbohydrates in food. You can use either of the methods or a combination of both. Reading "Nutrition Facts" on packaged food The "Nutrition Facts" list is included on the labels of almost all packaged foods and beverages in the [place] It includes:  The serving size.  Information about nutrients in each serving, including the grams (g) of carbohydrate per serving. To use the "Nutrition Facts":  Decide how many servings you will have.  Multiply the number of servings by the number of carbohydrates per serving.  The resulting number is the total amount of carbohydrates that you will be having. Learning standard serving sizes of other foods When you eat carbohydrate foods that are not packaged or do not include "Nutrition Facts" on the label, you need to measure the servings in order to count the amount of carbohydrates:  Measure the foods that you will eat with a food scale or measuring cup, if needed.   Decide how many standard-size servings you will eat.  Multiply the number of servings by 15. Most carbohydrate-rich foods have about 15 g of carbohydrates per serving. ? For example, if you eat 8 oz (170 g) of strawberries, you will have eaten 2 servings and 30 g of carbohydrates (2 servings x 15 g = 30 g).  For foods that have more than one food mixed, such as soups and casseroles, you must count the carbohydrates in each food that is included. The following list contains standard serving sizes of common carbohydrate-rich foods. Each of these servings has about 15 g of carbohydrates:   hamburger bun or  English muffin.   oz (15 mL) syrup.   oz (14 g) jelly.  1 slice of bread.  1 six-inch tortilla.  3 oz (85 g) cooked rice or pasta.  4 oz (113 g) cooked dried beans.  4 oz (113 g) starchy vegetable, such as peas, corn, or potatoes.  4 oz (113 g) hot cereal.  4 oz (113 g) mashed potatoes or  of a large baked potato.  4 oz (113 g) canned or frozen fruit.  4 oz (120 mL) fruit juice.  4-6 crackers.  6 chicken nuggets.  6 oz (170 g) unsweetened dry cereal.  6 oz (170 g) plain fat-free yogurt or yogurt sweetened with artificial sweeteners.  8 oz (240 mL) milk.  8 oz (170 g) fresh fruit or one small piece of fruit.  24 oz (680 g) popped popcorn. Example of carbohydrate counting Sample meal  3 oz (85 g) chicken breast.  6 oz (170 g)   brown rice.  4 oz (113 g) corn.  8 oz (240 mL) milk.  8 oz (170 g) strawberries with sugar-free whipped topping. Carbohydrate calculation 1. Identify the foods that contain carbohydrates: ? Rice. ? Corn. ? Milk. ? Strawberries. 2. Calculate how many servings you have of each food: ? 2 servings rice. ? 1 serving corn. ? 1 serving milk. ? 1 serving strawberries. 3. Multiply each number of servings by 15 g: ? 2 servings rice x 15 g = 30 g. ? 1 serving corn x 15 g = 15 g. ? 1 serving milk x 15 g = 15 g. ? 1 serving  strawberries x 15 g = 15 g. 4. Add together all of the amounts to find the total grams of carbohydrates eaten: ? 30 g + 15 g + 15 g + 15 g = 75 g of carbohydrates total. Summary  Carbohydrate counting is a method of keeping track of how many carbohydrates you eat.  Eating carbohydrates naturally increases the amount of sugar (glucose) in the blood.  Counting how many carbohydrates you eat helps keep your blood glucose within normal limits, which helps you manage your diabetes.  A diet and nutrition specialist (registered dietitian) can help you make a meal plan and calculate how many carbohydrates you should have at each meal and snack. This information is not intended to replace advice given to you by your health care provider. Make sure you discuss any questions you have with your health care provider. Document Released: 05/23/2005 Document Revised: 11/30/2016 Document Reviewed: 11/04/2015 Elsevier Interactive Patient Education  2019 Elsevier Inc.  

## 2018-07-16 NOTE — Assessment & Plan Note (Signed)
Doing well on Wellbutrin 300 mg daily, no changes

## 2018-07-16 NOTE — Assessment & Plan Note (Signed)
Encouraged DASH diet, decrease po intake and increase exercise as tolerated. Needs 7-8 hours of sleep nightly. Avoid trans fats, eat small, frequent meals every 4-5 hours with lean proteins, complex carbs and healthy fats. Minimize simple carbs. 16 pounds of weight loss since last visit.

## 2018-07-16 NOTE — Progress Notes (Signed)
Subjective:    Patient ID: Phyllis Martinez, female    DOB: 11/14/1965, 53 y.o.   MRN: [id number]  No chief complaint on file.   HPI Patient is in today for follow-up.  She is just gotten back from vacation and is feeling very well.  She is happy with recent weight loss.  She is using an app called the lose it app and she has been tracking her calories and staying more active.  She denies any recent febrile illness or acute hospitalizations.  No polyuria or polydipsia.  She is not monitoring her blood sugars but she feels they have been doing well lately. hgba1c acceptable, minimize simple carbs. Increase exercise as tolerated. Continue current meds  Past Medical History:  Diagnosis Date  . ACP (advance care planning) 11/13/2014  . Allergic state 05/26/2013  . Allergy   . Arthritis    RA  . Asthma   . DM (diabetes mellitus), type 2 (HCC) 12/24/2015  . Hypertension   . Mild intermittent asthma 11/16/2010  . Other and unspecified hyperlipidemia 04/21/2013  . Pedal edema 05/26/2013    Past Surgical History:  Procedure Laterality Date  . ADENOIDECTOMY    . CESAREAN SECTION      Family History  Problem Relation Age of Onset  . Hyperlipidemia Mother   . Hypothyroidism Mother   . Arthritis Father        RA  . Cancer Father        prostate  . Arthritis Maternal Grandmother   . Heart disease Daughter        congenital/congenital    Social History   Socioeconomic History  . Marital status: Married    Spouse name: Not on file  . Number of children: Not on file  . Years of education: Not on file  . Highest education level: Not on file  Occupational History  . Not on file  Social Needs  . Financial resource strain: Not on file  . Food insecurity:    Worry: Not on file    Inability: Not on file  . Transportation needs:    Medical: Not on file    Non-medical: Not on file  Tobacco Use  . Smoking status: Former Games developermoker  . Smokeless tobacco: Never Used  Substance and Sexual  Activity  . Alcohol use: Yes    Alcohol/week: 1.0 standard drinks    Types: 1 Glasses of wine per week  . Drug use: No  . Sexual activity: Yes    Comment: lives with husband, no dietary restriction  Lifestyle  . Physical activity:    Days per week: Not on file    Minutes per session: Not on file  . Stress: Not on file  Relationships  . Social connections:    Talks on phone: Not on file    Gets together: Not on file    Attends religious service: Not on file    Active member of club or organization: Not on file    Attends meetings of clubs or organizations: Not on file    Relationship status: Not on file  . Intimate partner violence:    Fear of current or ex partner: Not on file    Emotionally abused: Not on file    Physically abused: Not on file    Forced sexual activity: Not on file  Other Topics Concern  . Not on file  Social History Narrative  . Not on file    Outpatient Medications Prior to Visit  Medication Sig Dispense Refill  . albuterol (VENTOLIN HFA) 108 (90 Base) MCG/ACT inhaler INHALE 2 PUFFS INTO THE LUNGS EVERY 6 HOURS AS NEEDED FOR WHEEZING 18 g 1  . buPROPion (WELLBUTRIN XL) 300 MG 24 hr tablet Take 1 tablet (300 mg total) by mouth daily. 30 tablet 3  . fluticasone (FLOVENT HFA) 110 MCG/ACT inhaler INHALE 2 PUFFS INTO THE LUNGS TWICE DAILY AS NEEDED FOR SHORTNESS OF BREATH OR WHEEZING 12 g 1  . metFORMIN (GLUCOPHAGE) 500 MG tablet Take 1 tablet (500 mg total) by mouth daily. 30 tablet 3  . mometasone (NASONEX) 50 MCG/ACT nasal spray Place 2 sprays into the nose daily. 17 g 12  . buPROPion (WELLBUTRIN XL) 150 MG 24 hr tablet Take 1 tablet (150 mg total) by mouth daily. 4 tablet 0  . lisinopril (PRINIVIL,ZESTRIL) 10 MG tablet TAKE 1 TABLET(10 MG) BY MOUTH TWICE DAILY 60 tablet 1   No facility-administered medications prior to visit.     Allergies  Allergen Reactions  . Advair Diskus [Fluticasone-Salmeterol]   . Chloraseptic Sore Throat [Acetaminophen]   .  Hyzaar [Losartan Potassium-Hctz] Swelling  . Metoprolol     edema    Review of Systems  Constitutional: Negative for fever and malaise/fatigue.  HENT: Negative for congestion.   Eyes: Negative for blurred vision.  Respiratory: Negative for shortness of breath.   Cardiovascular: Negative for chest pain, palpitations and leg swelling.  Gastrointestinal: Negative for abdominal pain, blood in stool and nausea.  Genitourinary: Negative for dysuria and frequency.  Musculoskeletal: Negative for falls.  Skin: Negative for rash.  Neurological: Negative for dizziness, loss of consciousness and headaches.  Endo/Heme/Allergies: Negative for environmental allergies.  Psychiatric/Behavioral: Negative for depression. The patient is not nervous/anxious.        Objective:    Physical Exam Vitals signs and nursing note reviewed.  Constitutional:      General: She is not in acute distress.    Appearance: She is well-developed.  HENT:     Head: Normocephalic and atraumatic.     Nose: Nose normal.  Eyes:     General:        Right eye: No discharge.        Left eye: No discharge.  Neck:     Musculoskeletal: Normal range of motion and neck supple.  Cardiovascular:     Rate and Rhythm: Normal rate and regular rhythm.     Heart sounds: No murmur.  Pulmonary:     Effort: Pulmonary effort is normal.     Breath sounds: Normal breath sounds.  Abdominal:     General: Bowel sounds are normal.     Palpations: Abdomen is soft.     Tenderness: There is no abdominal tenderness.  Skin:    General: Skin is warm and dry.  Neurological:     Mental Status: She is alert and oriented to person, place, and time.     BP (!) 142/98 (BP Location: Left Arm, Patient Position: Sitting, Cuff Size: Normal)   Pulse 76   Temp 97.9 F (36.6 C) (Oral)   Resp 18   Ht 5\' 3"  (1.6 m)   Wt 280 lb 6.4 oz (127.2 kg)   SpO2 96%   BMI 49.67 kg/m  Wt Readings from Last 3 Encounters:  07/16/18 280 lb 6.4 oz (127.2  kg)  04/05/18 296 lb 6.4 oz (134.4 kg)  11/17/16 209 lb 9.6 oz (95.1 kg)     Lab Results  Component Value Date   WBC  9.2 04/05/2018   HGB 14.4 04/05/2018   HCT 42.5 04/05/2018   PLT 293.0 04/05/2018   GLUCOSE 111 (H) 04/05/2018   CHOL 229 (H) 04/05/2018   TRIG 205.0 (H) 04/05/2018   HDL 40.10 04/05/2018   LDLDIRECT 164.0 04/05/2018   LDLCALC 144 (H) 11/17/2016   ALT 13 04/05/2018   AST 15 04/05/2018   NA 136 04/05/2018   K 4.3 04/05/2018   CL 102 04/05/2018   CREATININE 0.77 04/05/2018   BUN 14 04/05/2018   CO2 27 04/05/2018   TSH 1.49 04/05/2018   HGBA1C 6.9 (H) 04/05/2018    Lab Results  Component Value Date   TSH 1.49 04/05/2018   Lab Results  Component Value Date   WBC 9.2 04/05/2018   HGB 14.4 04/05/2018   HCT 42.5 04/05/2018   MCV 89.6 04/05/2018   PLT 293.0 04/05/2018   Lab Results  Component Value Date   NA 136 04/05/2018   K 4.3 04/05/2018   CO2 27 04/05/2018   GLUCOSE 111 (H) 04/05/2018   BUN 14 04/05/2018   CREATININE 0.77 04/05/2018   BILITOT 0.5 04/05/2018   ALKPHOS 47 04/05/2018   AST 15 04/05/2018   ALT 13 04/05/2018   PROT 6.7 04/05/2018   ALBUMIN 4.0 04/05/2018   CALCIUM 9.2 04/05/2018   GFR 83.68 04/05/2018   Lab Results  Component Value Date   CHOL 229 (H) 04/05/2018   Lab Results  Component Value Date   HDL 40.10 04/05/2018   Lab Results  Component Value Date   LDLCALC 144 (H) 11/17/2016   Lab Results  Component Value Date   TRIG 205.0 (H) 04/05/2018   Lab Results  Component Value Date   CHOLHDL 6 04/05/2018   Lab Results  Component Value Date   HGBA1C 6.9 (H) 04/05/2018       Assessment & Plan:   Problem List Items Addressed This Visit    HTN (hypertension)    Well controlled, no changes to meds. Encouraged heart healthy diet such as the DASH diet and exercise as tolerated.       Obesity    Encouraged DASH diet, decrease po intake and increase exercise as tolerated. Needs 7-8 hours of sleep nightly.  Avoid trans fats, eat small, frequent meals every 4-5 hours with lean proteins, complex carbs and healthy fats. Minimize simple carbs. 16 pounds of weight loss since last visit.       Hyperlipidemia, mixed    Encouraged heart healthy diet, increase exercise, avoid trans fats, consider a krill oil cap daily      DM (diabetes mellitus), type 2 (HCC)    hgba1c acceptable, minimize simple carbs. Increase exercise as tolerated. Continue current meds. Declines glucometer      Depression with anxiety    Doing well on Wellbutrin 300 mg daily, no changes         I have discontinued Kanyia Shadden's lisinopril. I am also having her maintain her fluticasone, albuterol, mometasone, buPROPion, and metFORMIN.  No orders of the defined types were placed in this encounter.    Danise Edge, MD

## 2018-07-16 NOTE — Assessment & Plan Note (Signed)
Well controlled, no changes to meds. Encouraged heart healthy diet such as the DASH diet and exercise as tolerated.  °

## 2018-07-16 NOTE — Assessment & Plan Note (Addendum)
hgba1c acceptable, minimize simple carbs. Increase exercise as tolerated. Continue current meds. Declines glucometer

## 2018-07-16 NOTE — Assessment & Plan Note (Signed)
Encouraged heart healthy diet, increase exercise, avoid trans fats, consider a krill oil cap daily 

## 2018-07-19 MED ORDER — METFORMIN HCL 1000 MG PO TABS: 1000.0000 mg | ORAL_TABLET | Freq: Every day | ORAL | 3 refills | Status: DC

## 2018-09-19 ENCOUNTER — Other Ambulatory Visit: Payer: Self-pay | Admitting: Family Medicine

## 2018-09-20 ENCOUNTER — Encounter: Payer: Self-pay | Admitting: Family Medicine

## 2018-09-21 NOTE — Telephone Encounter (Signed)
[name] -- Can you look into this. Lisinopril is no longer on the current med list. Looks like an RX was sent on 07/26/18 but script had an end date of the same day.Marland Kitchen

## 2018-11-22 ENCOUNTER — Ambulatory Visit: Payer: 59 | Admitting: Family Medicine

## 2018-12-25 ENCOUNTER — Other Ambulatory Visit: Payer: Self-pay | Admitting: Family Medicine

## 2018-12-25 MED ORDER — FLOVENT HFA 110 MCG/ACT IN AERO: INHALATION_SPRAY | RESPIRATORY_TRACT | 5 refills | Status: DC

## 2018-12-25 MED ORDER — ALBUTEROL SULFATE HFA 108 (90 BASE) MCG/ACT IN AERS: INHALATION_SPRAY | RESPIRATORY_TRACT | 5 refills | Status: DC

## 2018-12-25 NOTE — Telephone Encounter (Signed)
Rxs sent

## 2018-12-25 NOTE — Telephone Encounter (Signed)
Medication Refill - Medication: fluticasone (FLOVENT HFA) 110 MCG/ACT inhaler   albuterol (VENTOLIN HFA) 108 (90 Base) MCG/ACT inhaler  Has the patient contacted their pharmacy? Yes.   (Agent: If no, request that the patient contact the pharmacy for the refill.) (Agent: If yes, when and what did the pharmacy advise?)Pharmacy advised Pt request have been sent to office with no response. Pt stated she has had a really hard time getting any refills since April   Preferred Pharmacy (with phone number or street name):  [facility] [place], [place] - [address] [place] [address] [place] [address] [phone] (Phone) [phone] (Fax)     Agent: Please be advised that RX refills may take up to 3 business days. We ask that you follow-up with your pharmacy.

## 2019-01-04 ENCOUNTER — Encounter: Payer: Self-pay | Admitting: Family Medicine

## 2019-01-04 ENCOUNTER — Ambulatory Visit (INDEPENDENT_AMBULATORY_CARE_PROVIDER_SITE_OTHER): Payer: 59 | Admitting: Family Medicine

## 2019-01-04 ENCOUNTER — Other Ambulatory Visit: Payer: Self-pay

## 2019-01-04 DIAGNOSIS — E1169 Type 2 diabetes mellitus with other specified complication: Secondary | ICD-10-CM

## 2019-01-04 DIAGNOSIS — E782 Mixed hyperlipidemia: Secondary | ICD-10-CM

## 2019-01-04 DIAGNOSIS — J4520 Mild intermittent asthma, uncomplicated: Secondary | ICD-10-CM | POA: Diagnosis not present

## 2019-01-04 DIAGNOSIS — F418 Other specified anxiety disorders: Secondary | ICD-10-CM

## 2019-01-04 DIAGNOSIS — E669 Obesity, unspecified: Secondary | ICD-10-CM

## 2019-01-04 DIAGNOSIS — I10 Essential (primary) hypertension: Secondary | ICD-10-CM | POA: Diagnosis not present

## 2019-01-04 MED ORDER — MOMETASONE FUROATE 50 MCG/ACT NA SUSP: 2.0000 | Freq: Every day | NASAL | 11 refills | Status: AC

## 2019-01-04 MED ORDER — ALBUTEROL SULFATE HFA 108 (90 BASE) MCG/ACT IN AERS: INHALATION_SPRAY | RESPIRATORY_TRACT | 11 refills | Status: AC

## 2019-01-04 MED ORDER — FLOVENT HFA 110 MCG/ACT IN AERO: INHALATION_SPRAY | RESPIRATORY_TRACT | 11 refills | Status: AC

## 2019-01-04 NOTE — Assessment & Plan Note (Signed)
hgba1c acceptable, minimize simple carbs. Increase exercise as tolerated. Continue current meds 

## 2019-01-04 NOTE — Assessment & Plan Note (Signed)
Encouraged to check vitals weekly, no changes to meds. Encouraged heart healthy diet such as the DASH diet and exercise as tolerated.  

## 2019-01-04 NOTE — Assessment & Plan Note (Signed)
Doing well without her Wellbutrin will not restart.

## 2019-01-04 NOTE — Progress Notes (Signed)
Virtual Visit via Video Note  I connected with Phyllis Martinez on 01/04/19 at  8:20 AM EDT by a video enabled telemedicine application and verified that I am speaking with the correct person using two identifiers.  Location: Patient: home Provider: home   I discussed the limitations of evaluation and management by telemedicine and the availability of in person appointments. The patient expressed understanding and agreed to proceed. Crissie SicklesPrincess Carter, CMA was able to get patient set up on a video visit    Subjective:    Patient ID: Phyllis Martinez, female    DOB: 11/21/1965, 53 y.o.   MRN: [id number]  No chief complaint on file.   HPI Patient is in today for follow up on chronic medical concerns such as asthma, obesity, hypertension and more. She is feeling well. She has continued to go to work throughout the pandemic but her work is doing a good job with masking. No recent febrile illness or hospitalizations. No polyuria or polydipsia. Denies CP/palp/SOB/HA/congestion/fevers/GI or GU c/o. Taking meds as prescribed  Past Medical History:  Diagnosis Date  . ACP (advance care planning) 11/13/2014  . Allergic state 05/26/2013  . Allergy   . Arthritis    RA  . Asthma   . DM (diabetes mellitus), type 2 (HCC) 12/24/2015  . Hypertension   . Mild intermittent asthma 11/16/2010  . Other and unspecified hyperlipidemia 04/21/2013  . Pedal edema 05/26/2013    Past Surgical History:  Procedure Laterality Date  . ADENOIDECTOMY    . CESAREAN SECTION      Family History  Problem Relation Age of Onset  . Hyperlipidemia Mother   . Hypothyroidism Mother   . Arthritis Father        RA  . Cancer Father        prostate  . Heart disease Daughter        congenital/congenital    Social History   Socioeconomic History  . Marital status: Married    Spouse name: Not on file  . Number of children: Not on file  . Years of education: Not on file  . Highest education level: Not on file  Occupational  History  . Not on file  Social Needs  . Financial resource strain: Not on file  . Food insecurity    Worry: Not on file    Inability: Not on file  . Transportation needs    Medical: Not on file    Non-medical: Not on file  Tobacco Use  . Smoking status: Former Games developermoker  . Smokeless tobacco: Never Used  Substance and Sexual Activity  . Alcohol use: Yes    Alcohol/week: 1.0 standard drinks    Types: 1 Glasses of wine per week  . Drug use: No  . Sexual activity: Yes    Comment: lives with husband, no dietary restriction  Lifestyle  . Physical activity    Days per week: Not on file    Minutes per session: Not on file  . Stress: Not on file  Relationships  . Social Musicianconnections    Talks on phone: Not on file    Gets together: Not on file    Attends religious service: Not on file    Active member of club or organization: Not on file    Attends meetings of clubs or organizations: Not on file    Relationship status: Not on file  . Intimate partner violence    Fear of current or ex partner: Not on file    Emotionally  abused: Not on file    Physically abused: Not on file    Forced sexual activity: Not on file  Other Topics Concern  . Not on file  Social History Narrative  . Not on file    Outpatient Medications Prior to Visit  Medication Sig Dispense Refill  . albuterol (VENTOLIN HFA) 108 (90 Base) MCG/ACT inhaler INHALE 2 PUFFS INTO THE LUNGS EVERY 6 HOURS AS NEEDED FOR WHEEZING 18 g 5  . buPROPion (WELLBUTRIN XL) 300 MG 24 hr tablet Take 1 tablet (300 mg total) by mouth daily. 30 tablet 3  . fluticasone (FLOVENT HFA) 110 MCG/ACT inhaler INHALE 2 PUFFS INTO THE LUNGS TWICE DAILY AS NEEDED FOR SHORTNESS OF BREATH OR WHEEZING 12 g 5  . metFORMIN (GLUCOPHAGE) 1000 MG tablet Take 1 tablet (1,000 mg total) by mouth daily. 180 tablet 3  . mometasone (NASONEX) 50 MCG/ACT nasal spray Place 2 sprays into the nose daily. 17 g 12   No facility-administered medications prior to visit.      Allergies  Allergen Reactions  . Advair Diskus [Fluticasone-Salmeterol]   . Chloraseptic Sore Throat [Acetaminophen]   . Hyzaar [Losartan Potassium-Hctz] Swelling  . Metoprolol     edema    Review of Systems  Constitutional: Negative for fever and malaise/fatigue.  HENT: Negative for congestion.   Eyes: Negative for blurred vision.  Respiratory: Negative for shortness of breath.   Cardiovascular: Negative for chest pain, palpitations and leg swelling.  Gastrointestinal: Negative for abdominal pain, blood in stool and nausea.  Genitourinary: Negative for dysuria and frequency.  Musculoskeletal: Negative for falls.  Skin: Negative for rash.  Neurological: Negative for dizziness, loss of consciousness and headaches.  Endo/Heme/Allergies: Negative for environmental allergies.  Psychiatric/Behavioral: Negative for depression. The patient is not nervous/anxious.        Objective:    Physical Exam Constitutional:      Appearance: Normal appearance. She is obese. She is not ill-appearing.  HENT:     Head: Normocephalic and atraumatic.     Nose: Nose normal.  Eyes:     General:        Right eye: No discharge.        Left eye: No discharge.  Pulmonary:     Effort: Pulmonary effort is normal.  Neurological:     Mental Status: She is alert and oriented to person, place, and time.  Psychiatric:        Mood and Affect: Mood normal.        Behavior: Behavior normal.     There were no vitals taken for this visit. Wt Readings from Last 3 Encounters:  07/16/18 280 lb 6.4 oz (127.2 kg)  04/05/18 296 lb 6.4 oz (134.4 kg)  11/17/16 209 lb 9.6 oz (95.1 kg)    Diabetic Foot Exam - Simple   No data filed     Lab Results  Component Value Date   WBC 8.1 07/16/2018   HGB 14.5 07/16/2018   HCT 42.8 07/16/2018   PLT 314.0 07/16/2018   GLUCOSE 96 07/16/2018   CHOL 231 (H) 07/16/2018   TRIG 369.0 (H) 07/16/2018   HDL 34.70 (L) 07/16/2018   LDLDIRECT 143.0 07/16/2018   LDLCALC  144 (H) 11/17/2016   ALT 14 07/16/2018   AST 14 07/16/2018   NA 139 07/16/2018   K 4.0 07/16/2018   CL 102 07/16/2018   CREATININE 0.83 07/16/2018   BUN 12 07/16/2018   CO2 29 07/16/2018   TSH 1.35 07/16/2018  HGBA1C 6.3 07/16/2018    Lab Results  Component Value Date   TSH 1.35 07/16/2018   Lab Results  Component Value Date   WBC 8.1 07/16/2018   HGB 14.5 07/16/2018   HCT 42.8 07/16/2018   MCV 89.0 07/16/2018   PLT 314.0 07/16/2018   Lab Results  Component Value Date   NA 139 07/16/2018   K 4.0 07/16/2018   CO2 29 07/16/2018   GLUCOSE 96 07/16/2018   BUN 12 07/16/2018   CREATININE 0.83 07/16/2018   BILITOT 0.4 07/16/2018   ALKPHOS 53 07/16/2018   AST 14 07/16/2018   ALT 14 07/16/2018   PROT 6.8 07/16/2018   ALBUMIN 4.0 07/16/2018   CALCIUM 9.7 07/16/2018   GFR 72.12 07/16/2018   Lab Results  Component Value Date   CHOL 231 (H) 07/16/2018   Lab Results  Component Value Date   HDL 34.70 (L) 07/16/2018   Lab Results  Component Value Date   LDLCALC 144 (H) 11/17/2016   Lab Results  Component Value Date   TRIG 369.0 (H) 07/16/2018   Lab Results  Component Value Date   CHOLHDL 7 07/16/2018   Lab Results  Component Value Date   HGBA1C 6.3 07/16/2018       Assessment & Plan:   Problem List Items Addressed This Visit    Mild intermittent asthma    Doing well, given refills today      Relevant Medications   albuterol (VENTOLIN HFA) 108 (90 Base) MCG/ACT inhaler   fluticasone (FLOVENT HFA) 110 MCG/ACT inhaler   HTN (hypertension) - Primary    Encouraged to check vitals weekly, no changes to meds. Encouraged heart healthy diet such as the DASH diet and exercise as tolerated.       Relevant Orders   CBC   Comprehensive metabolic panel   TSH   Obesity     Encouraged decrease po intake and increase exercise as tolerated. Needs 7-8 hours of sleep nightly. Avoid trans fats, eat small, frequent meals every 4-5 hours with lean proteins, complex  carbs and healthy fats. Minimize simple carbs, GMO foods.      Hyperlipidemia, mixed    Encouraged heart healthy diet, increase exercise, avoid trans fats, consider a krill oil cap daily      Relevant Orders   Lipid panel   DM (diabetes mellitus), type 2 (HCC)    hgba1c acceptable, minimize simple carbs. Increase exercise as tolerated. Continue current meds      Relevant Orders   Hemoglobin A1c   Microalbumin / creatinine urine ratio   Depression with anxiety    Doing well without her Wellbutrin will not restart.         I have discontinued Phyllis StanleyLisa Lerew's buPROPion and metFORMIN. I am also having her maintain her albuterol, Flovent HFA, and mometasone.  Meds ordered this encounter  Medications  . albuterol (VENTOLIN HFA) 108 (90 Base) MCG/ACT inhaler    Sig: INHALE 2 PUFFS INTO THE LUNGS EVERY 6 HOURS AS NEEDED FOR WHEEZING    Dispense:  18 g    Refill:  11  . fluticasone (FLOVENT HFA) 110 MCG/ACT inhaler    Sig: INHALE 2 PUFFS INTO THE LUNGS TWICE DAILY AS NEEDED FOR SHORTNESS OF BREATH OR WHEEZING    Dispense:  12 g    Refill:  11  . mometasone (NASONEX) 50 MCG/ACT nasal spray    Sig: Place 2 sprays into the nose daily.    Dispense:  17 g  Refill:  11     I discussed the assessment and treatment plan with the patient. The patient was provided an opportunity to ask questions and all were answered. The patient agreed with the plan and demonstrated an understanding of the instructions.   The patient was advised to call back or seek an in-person evaluation if the symptoms worsen or if the condition fails to improve as anticipated.  I provided 25 minutes of non-face-to-face time during this encounter.   Danise EdgeStacey Blyth, MD

## 2019-01-04 NOTE — Assessment & Plan Note (Signed)
Encouraged heart healthy diet, increase exercise, avoid trans fats, consider a krill oil cap daily 

## 2019-01-04 NOTE — Assessment & Plan Note (Signed)
Encouraged decrease po intake and increase exercise as tolerated. Needs 7-8 hours of sleep nightly. Avoid trans fats, eat small, frequent meals every 4-5 hours with lean proteins, complex carbs and healthy fats. Minimize simple carbs, GMO foods.

## 2019-01-04 NOTE — Assessment & Plan Note (Signed)
Doing well, given refills today

## 2019-04-09 ENCOUNTER — Encounter: Payer: Self-pay | Admitting: Family Medicine
